# Patient Record
Sex: Female | Born: 1960
Health system: Southern US, Community
[De-identification: ages and names within clinical notes are randomized; demographics above are authoritative.]

## PROBLEM LIST (undated history)

## (undated) DIAGNOSIS — E079 Disorder of thyroid, unspecified: Secondary | ICD-10-CM

## (undated) HISTORY — DX: Disorder of thyroid, unspecified: E07.9

---

## 2003-11-27 ENCOUNTER — Other Ambulatory Visit: Admission: RE | Admit: 2003-11-27 | Discharge: 2003-11-27 | Payer: Self-pay | Admitting: Family Medicine

## 2003-12-29 ENCOUNTER — Encounter: Admission: RE | Admit: 2003-12-29 | Discharge: 2003-12-29 | Payer: Self-pay | Admitting: Family Medicine

## 2004-10-28 ENCOUNTER — Encounter: Admission: RE | Admit: 2004-10-28 | Discharge: 2004-10-28 | Payer: Self-pay | Admitting: Family Medicine

## 2005-02-25 ENCOUNTER — Encounter: Admission: RE | Admit: 2005-02-25 | Discharge: 2005-02-25 | Payer: Self-pay | Admitting: Family Medicine

## 2005-03-08 ENCOUNTER — Encounter: Admission: RE | Admit: 2005-03-08 | Discharge: 2005-03-08 | Payer: Self-pay | Admitting: Family Medicine

## 2005-03-30 ENCOUNTER — Other Ambulatory Visit: Admission: RE | Admit: 2005-03-30 | Discharge: 2005-03-30 | Payer: Self-pay | Admitting: Family Medicine

## 2005-08-05 ENCOUNTER — Ambulatory Visit: Payer: Self-pay | Admitting: Family Medicine

## 2005-08-31 ENCOUNTER — Encounter: Admission: RE | Admit: 2005-08-31 | Discharge: 2005-08-31 | Payer: Self-pay | Admitting: Family Medicine

## 2006-03-03 ENCOUNTER — Encounter: Admission: RE | Admit: 2006-03-03 | Discharge: 2006-03-03 | Payer: Self-pay | Admitting: Obstetrics and Gynecology

## 2006-11-01 ENCOUNTER — Encounter: Admission: RE | Admit: 2006-11-01 | Discharge: 2006-11-01 | Payer: Self-pay | Admitting: Obstetrics and Gynecology

## 2007-03-07 ENCOUNTER — Encounter: Admission: RE | Admit: 2007-03-07 | Discharge: 2007-03-07 | Payer: Self-pay | Admitting: Obstetrics and Gynecology

## 2007-03-09 ENCOUNTER — Encounter: Admission: RE | Admit: 2007-03-09 | Discharge: 2007-03-09 | Payer: Self-pay | Admitting: Obstetrics and Gynecology

## 2008-03-12 ENCOUNTER — Encounter: Admission: RE | Admit: 2008-03-12 | Discharge: 2008-03-12 | Payer: Self-pay | Admitting: Obstetrics and Gynecology

## 2008-03-17 ENCOUNTER — Encounter: Admission: RE | Admit: 2008-03-17 | Discharge: 2008-03-17 | Payer: Self-pay | Admitting: Obstetrics and Gynecology

## 2009-03-18 ENCOUNTER — Encounter: Admission: RE | Admit: 2009-03-18 | Discharge: 2009-03-18 | Payer: Self-pay | Admitting: Obstetrics and Gynecology

## 2010-03-19 ENCOUNTER — Encounter: Admission: RE | Admit: 2010-03-19 | Discharge: 2010-03-19 | Payer: Self-pay | Admitting: Obstetrics and Gynecology

## 2011-08-15 ENCOUNTER — Other Ambulatory Visit: Payer: Self-pay | Admitting: Obstetrics and Gynecology

## 2012-02-22 ENCOUNTER — Other Ambulatory Visit: Payer: Self-pay | Admitting: Obstetrics and Gynecology

## 2012-02-22 DIAGNOSIS — Z1231 Encounter for screening mammogram for malignant neoplasm of breast: Secondary | ICD-10-CM

## 2012-03-09 ENCOUNTER — Ambulatory Visit
Admission: RE | Admit: 2012-03-09 | Discharge: 2012-03-09 | Disposition: A | Discharge status: Home / Self Care | Payer: 59 | Source: Ambulatory Visit | Attending: Obstetrics and Gynecology | Admitting: Obstetrics and Gynecology

## 2012-03-09 DIAGNOSIS — Z1231 Encounter for screening mammogram for malignant neoplasm of breast: Secondary | ICD-10-CM

## 2013-06-04 ENCOUNTER — Other Ambulatory Visit: Payer: Self-pay

## 2013-06-04 DIAGNOSIS — Z1231 Encounter for screening mammogram for malignant neoplasm of breast: Secondary | ICD-10-CM

## 2013-07-08 ENCOUNTER — Ambulatory Visit
Admission: RE | Admit: 2013-07-08 | Discharge: 2013-07-08 | Disposition: A | Discharge status: Home / Self Care | Payer: 59 | Source: Ambulatory Visit

## 2013-07-08 DIAGNOSIS — Z1231 Encounter for screening mammogram for malignant neoplasm of breast: Secondary | ICD-10-CM

## 2014-04-15 ENCOUNTER — Other Ambulatory Visit: Payer: Self-pay

## 2014-04-15 DIAGNOSIS — Z1231 Encounter for screening mammogram for malignant neoplasm of breast: Secondary | ICD-10-CM

## 2014-07-09 ENCOUNTER — Ambulatory Visit: Payer: 59

## 2014-08-14 ENCOUNTER — Ambulatory Visit
Admission: RE | Admit: 2014-08-14 | Discharge: 2014-08-14 | Disposition: A | Discharge status: Home / Self Care | Payer: 59 | Source: Ambulatory Visit

## 2014-08-14 DIAGNOSIS — Z1231 Encounter for screening mammogram for malignant neoplasm of breast: Secondary | ICD-10-CM

## 2015-07-14 ENCOUNTER — Other Ambulatory Visit: Payer: Self-pay

## 2015-07-14 DIAGNOSIS — Z1231 Encounter for screening mammogram for malignant neoplasm of breast: Secondary | ICD-10-CM

## 2015-08-18 ENCOUNTER — Ambulatory Visit: Payer: 59

## 2015-10-20 ENCOUNTER — Ambulatory Visit: Payer: 59

## 2016-10-04 DIAGNOSIS — E0789 Other specified disorders of thyroid: Secondary | ICD-10-CM | POA: Diagnosis not present

## 2016-10-04 DIAGNOSIS — E890 Postprocedural hypothyroidism: Secondary | ICD-10-CM | POA: Diagnosis not present

## 2016-10-04 DIAGNOSIS — Z0000 Encounter for general adult medical examination without abnormal findings: Secondary | ICD-10-CM | POA: Diagnosis not present

## 2016-10-11 DIAGNOSIS — D225 Melanocytic nevi of trunk: Secondary | ICD-10-CM | POA: Diagnosis not present

## 2016-10-11 DIAGNOSIS — E559 Vitamin D deficiency, unspecified: Secondary | ICD-10-CM | POA: Diagnosis not present

## 2016-10-11 DIAGNOSIS — L820 Inflamed seborrheic keratosis: Secondary | ICD-10-CM | POA: Diagnosis not present

## 2016-10-11 DIAGNOSIS — E032 Hypothyroidism due to medicaments and other exogenous substances: Secondary | ICD-10-CM | POA: Diagnosis not present

## 2016-10-11 DIAGNOSIS — L821 Other seborrheic keratosis: Secondary | ICD-10-CM | POA: Diagnosis not present

## 2016-10-11 DIAGNOSIS — L814 Other melanin hyperpigmentation: Secondary | ICD-10-CM | POA: Diagnosis not present

## 2016-12-23 DIAGNOSIS — Z01419 Encounter for gynecological examination (general) (routine) without abnormal findings: Secondary | ICD-10-CM | POA: Diagnosis not present

## 2017-10-06 ENCOUNTER — Ambulatory Visit (INDEPENDENT_AMBULATORY_CARE_PROVIDER_SITE_OTHER): Payer: 59 | Admitting: Nurse Practitioner

## 2017-10-06 ENCOUNTER — Encounter: Payer: Self-pay | Admitting: Nurse Practitioner

## 2017-10-06 VITALS — BP 132/86 | Wt 135.0 lb

## 2017-10-06 DIAGNOSIS — E890 Postprocedural hypothyroidism: Secondary | ICD-10-CM

## 2017-10-06 MED ORDER — LEVOTHYROXINE SODIUM 125 MCG PO TABS: 125.0000 ug | ORAL_TABLET | Freq: Every day | ORAL | 3 refills | Status: DC

## 2017-10-07 ENCOUNTER — Encounter: Payer: Self-pay | Admitting: Nurse Practitioner

## 2017-10-07 DIAGNOSIS — E890 Postprocedural hypothyroidism: Secondary | ICD-10-CM | POA: Insufficient documentation

## 2017-10-07 NOTE — Progress Notes (Signed)
Subjective: Presents to establish herself as a new patient to our practice.  Gets regular preventive health physicals with gynecology.  Her next appointment is in June, she will get a mammogram and bone density as well at that visit.  Her last appointment was with her endocrinologist in January, had lab work done which she states was normal, results unavailable during office visit.  Was diagnosed with hyperthyroidism in 1999.  Received radioactive iodine which resulted in hypothyroidism.  No difficulty swallowing.  No sore throats or throat pain.  Postmenopausal.  Had her last colonoscopy about 2 years ago which was normal.  Donates blood on a regular basis, has been screened for HIV and hepatitis C.  States her weight has been stable.  Very active lifestyle.  Objective:   BP 132/86   Wt 135 lb (61.2 kg)  NAD.  Alert, oriented.  Cheerful affect.  Lungs clear.  Heart regular rate and rhythm.  Thyroid nontender to palpation, no mass or goiter noted.  Assessment:   Problem List Items Addressed This Visit      Endocrine   Postablative hypothyroidism - Primary   Relevant Medications   levothyroxine (SYNTHROID, LEVOTHROID) 125 MCG tablet       Plan:   Meds ordered this encounter  Medications  . levothyroxine (SYNTHROID, LEVOTHROID) 125 MCG tablet    Sig: Take 1 tablet (125 mcg total) by mouth daily before breakfast.    Dispense:  90 tablet    Refill:  3    Please dispense name brand Synthroid    Order Specific Question:   Supervising Provider    Answer:   Mikey Kirschner [[id number]]   Continue Synthroid at current dose.  Follow-up with gynecology this summer for her physical.  States she had all her screening done with last lab work in January.  Recommend that she bring a copy to her next visit. Return in about 1 year (around 10/07/2018) for recheck thyroid.

## 2017-10-27 DIAGNOSIS — L821 Other seborrheic keratosis: Secondary | ICD-10-CM | POA: Diagnosis not present

## 2017-10-27 DIAGNOSIS — D485 Neoplasm of uncertain behavior of skin: Secondary | ICD-10-CM | POA: Diagnosis not present

## 2017-10-27 DIAGNOSIS — D1801 Hemangioma of skin and subcutaneous tissue: Secondary | ICD-10-CM | POA: Diagnosis not present

## 2017-10-27 DIAGNOSIS — L439 Lichen planus, unspecified: Secondary | ICD-10-CM | POA: Diagnosis not present

## 2017-10-27 DIAGNOSIS — D225 Melanocytic nevi of trunk: Secondary | ICD-10-CM | POA: Diagnosis not present

## 2018-01-10 DIAGNOSIS — Z6826 Body mass index (BMI) 26.0-26.9, adult: Secondary | ICD-10-CM | POA: Diagnosis not present

## 2018-01-10 DIAGNOSIS — Z01419 Encounter for gynecological examination (general) (routine) without abnormal findings: Secondary | ICD-10-CM | POA: Diagnosis not present

## 2018-10-12 DIAGNOSIS — S99922A Unspecified injury of left foot, initial encounter: Secondary | ICD-10-CM | POA: Diagnosis not present

## 2018-10-12 DIAGNOSIS — S90852A Superficial foreign body, left foot, initial encounter: Secondary | ICD-10-CM | POA: Diagnosis not present

## 2018-10-15 ENCOUNTER — Other Ambulatory Visit: Payer: Self-pay

## 2018-10-15 ENCOUNTER — Ambulatory Visit (INDEPENDENT_AMBULATORY_CARE_PROVIDER_SITE_OTHER): Payer: 59 | Admitting: Family Medicine

## 2018-10-15 ENCOUNTER — Encounter: Payer: Self-pay | Admitting: Family Medicine

## 2018-10-15 VITALS — BP 122/72 | Temp 98.1°F

## 2018-10-15 DIAGNOSIS — L03116 Cellulitis of left lower limb: Secondary | ICD-10-CM | POA: Diagnosis not present

## 2018-10-15 MED ORDER — SULFAMETHOXAZOLE-TRIMETHOPRIM 800-160 MG PO TABS: ORAL_TABLET | ORAL | 0 refills | Status: DC

## 2018-10-15 MED ORDER — CEPHALEXIN 500 MG PO CAPS: ORAL_CAPSULE | ORAL | 0 refills | Status: DC

## 2018-10-15 NOTE — Progress Notes (Signed)
   Subjective:    Patient ID: Susan Cabrera, female    DOB: 1960-08-08, 58 y.o.   MRN: [id number]  HPI Pt here today for foot pain. Pt states she stepped on a branch and it went into her left foot. Pt did go to ER. ER did get most of branch out. Foot is swollen and red, does throb. Pt states she is unable to put pressure on foot.    Pt went to [facility] hospital. Pt was prescribed Cephalexin 500 and Bactrin DS. Pt was also prescribed Oxycodone 5/325 mg but patient has only taken one dose. Pt has been alternating Tylenol and Motrin and that is working for patient.  Patient was up-to-date on tetanus shot  Compliant with antibiotics.  Reports that foot seems a little better today.  Not sure.  No fever chills good appetite   Review of Systems No headache, no major weight loss or weight gain, no chest pain no back pain abdominal pain no change in bowel habits complete ROS otherwise negative     Objective:   Physical Exam Alert vitals stable, NAD. Blood pressure good on repeat. HEENT normal. Lungs clear. Heart regular rate and rhythm. Cellulitis left foot dorsal erythema swelling highly tender plantar surface entrance wound no palpable foreign body also tender no active discharge       Assessment & Plan:  Impression puncture wound with rather large of wood and patient brings in.  Now secondary cellulitis.  Management discussed at length.  Elevate leg when possible.  Increase Keflex to 4 4 times daily and maintain Bactrim DS twice daily for 10 full days post.  Warning signs discussed.  And further warning signs discussed regarding potential residual foreign body  Greater than 50% of this 25 minute face to face visit was spent in counseling and discussion and coordination of care regarding the above diagnosis/diagnosies

## 2018-10-22 ENCOUNTER — Telehealth: Payer: Self-pay | Admitting: Family Medicine

## 2018-10-22 NOTE — Telephone Encounter (Signed)
Left message to return call 

## 2018-10-22 NOTE — Telephone Encounter (Signed)
Rash started yesterday. Was tolerable. Had one more day to take ABT. No trouble breathing, no fever, no chills, no cough, no headache. Foot is better, swelling and redness that was in leg has went away. Pt states she wanted to inform provider. Pt states she knows the rash is coming from ABT. Pt states she does not feel the need to be seen

## 2018-10-22 NOTE — Telephone Encounter (Signed)
Ok document abx allergy, may use otc benadryl 25 q six hrs prn if gets real symtomatic

## 2018-10-22 NOTE — Telephone Encounter (Signed)
Documented allergy. Left message for patient to return call to let her know that she may use Benadryl.

## 2018-10-22 NOTE — Telephone Encounter (Signed)
Patient states foot is better but now she has a rash all over her body and she stopped taking antibiotic this morning due to rash was on it for 5 days 4 times a day.

## 2018-10-23 NOTE — Telephone Encounter (Signed)
Left message to return call 

## 2018-10-26 NOTE — Telephone Encounter (Signed)
Left message to return call 

## 2018-10-29 ENCOUNTER — Telehealth: Payer: Self-pay | Admitting: Family Medicine

## 2018-10-29 ENCOUNTER — Other Ambulatory Visit: Payer: Self-pay | Admitting: *Deleted

## 2018-10-29 MED ORDER — LEVOTHYROXINE SODIUM 125 MCG PO TABS: 125.0000 ug | ORAL_TABLET | Freq: Every day | ORAL | 0 refills | Status: DC

## 2018-10-29 NOTE — Telephone Encounter (Signed)
Three mo worth

## 2018-10-29 NOTE — Telephone Encounter (Signed)
Pharmacy requesting refill on Synthroid 125 mcg tablet. Pt last seen 10/06/17 for postablative hypothyroidism. Please advise. Thank you

## 2018-10-29 NOTE — Telephone Encounter (Signed)
Left message to return call 

## 2018-10-29 NOTE — Telephone Encounter (Signed)
Refills sent

## 2018-10-31 NOTE — Telephone Encounter (Signed)
Pt has not called after multiple messages left. Message left 9 days ago about a rash. Can we close message?

## 2018-10-31 NOTE — Telephone Encounter (Signed)
yes

## 2018-11-19 ENCOUNTER — Other Ambulatory Visit: Payer: Self-pay | Admitting: *Deleted

## 2018-11-19 ENCOUNTER — Telehealth: Payer: Self-pay | Admitting: Family Medicine

## 2018-11-19 ENCOUNTER — Telehealth: Payer: Self-pay | Admitting: *Deleted

## 2018-11-19 DIAGNOSIS — S90859A Superficial foreign body, unspecified foot, initial encounter: Secondary | ICD-10-CM

## 2018-11-19 MED ORDER — DOXYCYCLINE HYCLATE 100 MG PO TABS: 100.0000 mg | ORAL_TABLET | Freq: Two times a day (BID) | ORAL | 0 refills | Status: DC

## 2018-11-19 NOTE — Telephone Encounter (Signed)
Discussed with pt. Pt verbalized understanding. And she now states she does not care which surgeon she sees whoever can get her in the quickest

## 2018-11-19 NOTE — Telephone Encounter (Signed)
Pt had puncture wound with a piece of wood on march 31st. Was seen for cellulitis and finished antibiotic. Foot is still swollen and pt thinks it is a piece of wood still in foot. A piece of wood did come out since being seen. Consult with dr Richardson Landry because pt wanted to come in office today and dr Richardson Landry wants to call in antibiotic and set pt up with a surgeon. Tried to call pt no answer

## 2018-11-19 NOTE — Telephone Encounter (Signed)
Pt called and wanted to be checked in the office. Puncture wound with a piece of wood march 31st. Pt was seen and states her foot is worse. Swollen. Has gotten a piece of wood out of foot since being seen and thinks there is another piece of wood in it. Consult with dr Richardson Landry. Will call in antibiotic and set pt up with a general surgeon. Tried to call pt no answer.

## 2018-11-19 NOTE — Telephone Encounter (Signed)
Patient calling back to tell us that the place she wanted to go doesn't do feet and now wants [place] to refer her to Dr. Ninfa Linden who is a TEFL teacher in [place]. (She thinks)

## 2018-11-19 NOTE — Telephone Encounter (Signed)
Med sent to [facility] and referral put in. Tried to call pt no answer

## 2018-11-19 NOTE — Telephone Encounter (Signed)
Doxy 100 bid ten d, surg referal

## 2018-11-19 NOTE — Telephone Encounter (Signed)
Pt was put on bactrim ds and keflex. She states she took for about 6 days and then stopped because she started getting hives but her foot was good then so she didn't call for another antibiotic. [facility] [place] and would like to see surgeon in [place] at the surgical center on [address]. Referral put in.

## 2018-11-20 ENCOUNTER — Ambulatory Visit (INDEPENDENT_AMBULATORY_CARE_PROVIDER_SITE_OTHER): Payer: 59

## 2018-11-20 ENCOUNTER — Ambulatory Visit (INDEPENDENT_AMBULATORY_CARE_PROVIDER_SITE_OTHER): Payer: 59 | Admitting: Sports Medicine

## 2018-11-20 ENCOUNTER — Other Ambulatory Visit: Payer: Self-pay | Admitting: Sports Medicine

## 2018-11-20 ENCOUNTER — Other Ambulatory Visit: Payer: Self-pay

## 2018-11-20 ENCOUNTER — Encounter: Payer: Self-pay | Admitting: Sports Medicine

## 2018-11-20 VITALS — BP 127/81 | HR 83 | Temp 98.1°F

## 2018-11-20 DIAGNOSIS — S91332A Puncture wound without foreign body, left foot, initial encounter: Secondary | ICD-10-CM | POA: Diagnosis not present

## 2018-11-20 DIAGNOSIS — L02619 Cutaneous abscess of unspecified foot: Secondary | ICD-10-CM

## 2018-11-20 DIAGNOSIS — M79672 Pain in left foot: Secondary | ICD-10-CM

## 2018-11-20 DIAGNOSIS — L03119 Cellulitis of unspecified part of limb: Secondary | ICD-10-CM

## 2018-11-20 DIAGNOSIS — L929 Granulomatous disorder of the skin and subcutaneous tissue, unspecified: Secondary | ICD-10-CM

## 2018-11-20 DIAGNOSIS — M795 Residual foreign body in soft tissue: Secondary | ICD-10-CM

## 2018-11-20 NOTE — Progress Notes (Signed)
Subjective: Susan Cabrera is a 58 y.o. female patient seen in office for evaluation of ulceration of the left foot. Patient has a history of a tree branch stuck her foot on 3/27 and had a ER debridement procedure where the wood was removed. Reports on 4/16 another piece of wood came out and for the last 2 days has noticed that the swelling and redness has come back along with the puncture wound not healing and continuing to bleed.   Patient is changing the dressing using antibiotic cream and is currently on Doxycyline started on yesterday. Patient denies nausea/fever/vomiting/chills/night sweats/shortness of breath currently but thinks that she could of had a fever a few days ago. Patient reports pain with pressure and states that she has been walking funny to avoid putting pressure to the area. Pain 5/10. Patient has no other pedal complaints at this time.  Review of Systems  All other systems reviewed and are negative.   Patient Active Problem List   Diagnosis Date Noted  . Postablative hypothyroidism 10/07/2017   Current Outpatient Medications on File Prior to Visit  Medication Sig Dispense Refill  . cephALEXin (KEFLEX) 500 MG capsule Take 500 mg by mouth 3 (three) times daily.    . cephALEXin (KEFLEX) 500 MG capsule Take one tablet by mouth 4 times daily for 5 days 20 capsule 0  . doxycycline (VIBRA-TABS) 100 MG tablet Take 1 tablet (100 mg total) by mouth 2 (two) times daily. 20 tablet 0  . levothyroxine (SYNTHROID, LEVOTHROID) 125 MCG tablet Take 1 tablet (125 mcg total) by mouth daily before breakfast. 90 tablet 0  . oxyCODONE-acetaminophen (PERCOCET/ROXICET) 5-325 MG tablet Take 1-2 tablets by mouth every 4 (four) hours as needed. for pain    . sulfamethoxazole-trimethoprim (BACTRIM DS,SEPTRA DS) 800-160 MG tablet Take 1 tablet by mouth 2 (two) times daily.    Marland Kitchen sulfamethoxazole-trimethoprim (BACTRIM DS,SEPTRA DS) 800-160 MG tablet Take one tablet by mouth twice daily for 5 days 10  tablet 0  . valACYclovir (VALTREX) 1000 MG tablet TAKE 2 TABLETS BY MOUTH EVERY 12 HOURS FOR 2 DOSES     No current facility-administered medications on file prior to visit.    Allergies  Allergen Reactions  . Keflex [Cephalexin] Rash    No results found for this or any previous visit (from the past 2160 hour(s)).  Objective: There were no vitals filed for this visit.  General: Patient is awake, alert, oriented x 3 and in no acute distress.  Dermatology: Skin is warm and dry bilateral with a raised hypergranular ulceration measures approximately 0.5cm at left plantar distal arch. The ulceration does not  probe to bone. There is no malodor, no active drainage, faint erythema at 1st interspace and 1st MTPJ, trace edema. No other acute signs of infection.   Vascular: Dorsalis Pedis pulse =1 /4 Bilateral,  Posterior Tibial pulse = 1/4 Bilateral,  Capillary Fill Time < 5 seconds  Neurologic: Protective sensation intact via light touch bilateral.   Musculosketal: There is mild pain with palpation to puncture wound area. No pain with compression to calves bilateral. +midfoot arthritis dorsal bone spur bilateral.    No results for input(s): GRAMSTAIN, LABORGA in the last 8760 hours.  Assessment and Plan:  Problem List Items Addressed This Visit    None    Visit Diagnoses    Puncture wound of left foot, initial encounter    -  Primary   10-12-18   Relevant Orders   WOUND CULTURE   Pain in left  foot       Granuloma, skin       Residual foreign body in soft tissue       Cellulitis and abscess of foot, except toes           -Examined patient and discussed the progression of the wound and treatment alternatives. 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 -Patient to return to office in 1 week for follow up care and evaluation or sooner if problems arise.  [place] Martins, DPM

## 2018-11-21 ENCOUNTER — Telehealth: Payer: Self-pay | Admitting: *Deleted

## 2018-11-21 DIAGNOSIS — M86172 Other acute osteomyelitis, left ankle and foot: Secondary | ICD-10-CM

## 2018-11-21 DIAGNOSIS — Z01812 Encounter for preprocedural laboratory examination: Secondary | ICD-10-CM

## 2018-11-21 NOTE — Telephone Encounter (Signed)
Pt called states she was to have a MRI scheduled. I told pt I had called to see if she wanted to be scheduled for [place] or [place]. Pt stated [place]. I told pt I would send the orders to [facility] and they may call to schedule, for her to schedule 3-5 days out so pre-cert would be available.

## 2018-11-21 NOTE — Telephone Encounter (Signed)
-----   Message from [place], [place] sent at 11/20/2018 10:45 AM EDT ----- Regarding: MRI L foot Stepped on wood 09/2018 with wound and recurrent cellulitis R/o retained foreign body vs cellulitis Need for possible OR debridement Dr Chauncey Cruel

## 2018-11-21 NOTE — Telephone Encounter (Addendum)
Unable to leave a message pt's voicemail was full, requesting location pt preferred for MRI. Orders to Gretta Arab, RN for pre-cert, faxed to [facility].

## 2018-11-23 ENCOUNTER — Telehealth: Payer: Self-pay | Admitting: *Deleted

## 2018-11-23 LAB — WOUND CULTURE
GRAM STAIN:: NONE SEEN
MICRO NUMBER:: 446416
SPECIMEN QUALITY:: ADEQUATE

## 2018-11-23 MED ORDER — CIPROFLOXACIN HCL 500 MG PO TABS: 500.0000 mg | ORAL_TABLET | Freq: Two times a day (BID) | ORAL | 0 refills | Status: DC

## 2018-11-23 NOTE — Telephone Encounter (Signed)
Left statement,that due to the importance of the message I leave it on her voice mail, that Dr. Cannon Kettle had reviewed the culture results and to stop the doxycycline and begin the c[facility] that [facility] [place] on [address].

## 2018-11-23 NOTE — Telephone Encounter (Signed)
-----   Message from [place]is Martins, [place] sent at 11/23/2018  8:55 AM EDT ----- Let patient know that her culture came back + for Enterobacter. Have her to stop the doxcycline and send to her pharmacy cipro 500 bid x 10 days Thanks Dr Chauncey Cruel

## 2018-11-26 ENCOUNTER — Telehealth: Payer: Self-pay | Admitting: *Deleted

## 2018-11-26 ENCOUNTER — Other Ambulatory Visit: Payer: Self-pay

## 2018-11-26 ENCOUNTER — Ambulatory Visit (HOSPITAL_COMMUNITY)
Admission: RE | Admit: 2018-11-26 | Discharge: 2018-11-26 | Disposition: A | Discharge status: Home / Self Care | Payer: 59 | Source: Ambulatory Visit | Attending: Sports Medicine | Admitting: Sports Medicine

## 2018-11-26 DIAGNOSIS — M868X7 Other osteomyelitis, ankle and foot: Secondary | ICD-10-CM

## 2018-11-26 MED ORDER — GADOBUTROL 1 MMOL/ML IV SOLN
7.0000 mL | Freq: Once | INTRAVENOUS | Status: AC | PRN
  Administered 2018-11-26: 13:00:00 7 mL via INTRAVENOUS

## 2018-11-26 NOTE — Telephone Encounter (Signed)
I called [place] states pt's Member ID: [id number] expired 07/17/2018. Kathlee Nations found pt's updated [employer] ID: [id number], Group: [id number], Payor ID: [id number] valid 20/91/9802, and pre-cert would need to be with [facility] [phone].

## 2018-11-26 NOTE — Telephone Encounter (Signed)
Pt called and I informed of authorization and [place] Scheduling [phone].

## 2018-11-26 NOTE — Telephone Encounter (Signed)
I called and spoke with [facility] and she states the MRI Impressions show Foreign Body, abscess and erosion on the 1st plantar Metatarsal.

## 2018-11-26 NOTE — Telephone Encounter (Signed)
I informed pt [employer] had stated the Member ID on the card give to our office had expired and she should have that card for her appt with our office and Forestine Na, I would call with [facility] scheduling phone number once pre-certed.

## 2018-11-26 NOTE — Telephone Encounter (Signed)
Pt called states she would like to use either [facility] in [place] or [place].

## 2018-11-26 NOTE — Telephone Encounter (Signed)
[place] AUTHORIZED LEFT FOOT MRI [id number], AUTHORIZATION: [id number] FOR MEMBER ID: [id number], GROUP: [id number], PAYOR ID: [id number] VALID 11/26/2018 UNTIL 01/10/2019. Faxed to Main scheduling for [facility].

## 2018-11-26 NOTE — Telephone Encounter (Signed)
[facility] Radiology - Call Stat Report left message to call.

## 2018-11-26 NOTE — Telephone Encounter (Signed)
I will see the patient on tomorrow and discuss the results in person as well as her plan of care for these findings -Dr. Cannon Kettle

## 2018-11-26 NOTE — Telephone Encounter (Signed)
Left message informing Dr. Cannon Kettle, pt with MRN [id number] has an appt tomorrow 11:15am, stat MRI results are available and to call if there is a change of orders.

## 2018-11-26 NOTE — Telephone Encounter (Signed)
I called pt and left message the MRI had been approved and to contact [place] Scheduling [phone].

## 2018-11-26 NOTE — Telephone Encounter (Signed)
Pt states she called and the imaging center in [place] could get her in earlier than 12/06/2018, to have it pre-certed with [facility] in [place].

## 2018-11-26 NOTE — Telephone Encounter (Signed)
Pt called states she called the MRI center and she had not been scheduled and she said the earliest they could schedule her was 12/06/2018.

## 2018-11-26 NOTE — Telephone Encounter (Signed)
Left message to call with contact information for the location of MRI center she would prefer.

## 2018-11-27 ENCOUNTER — Encounter: Payer: Self-pay | Admitting: Sports Medicine

## 2018-11-27 ENCOUNTER — Telehealth: Payer: Self-pay | Admitting: *Deleted

## 2018-11-27 ENCOUNTER — Ambulatory Visit (INDEPENDENT_AMBULATORY_CARE_PROVIDER_SITE_OTHER): Payer: 59 | Admitting: Sports Medicine

## 2018-11-27 VITALS — Temp 95.2°F

## 2018-11-27 DIAGNOSIS — L929 Granulomatous disorder of the skin and subcutaneous tissue, unspecified: Secondary | ICD-10-CM

## 2018-11-27 DIAGNOSIS — S91332D Puncture wound without foreign body, left foot, subsequent encounter: Secondary | ICD-10-CM | POA: Diagnosis not present

## 2018-11-27 DIAGNOSIS — M868X7 Other osteomyelitis, ankle and foot: Secondary | ICD-10-CM

## 2018-11-27 DIAGNOSIS — L02619 Cutaneous abscess of unspecified foot: Secondary | ICD-10-CM

## 2018-11-27 DIAGNOSIS — L03119 Cellulitis of unspecified part of limb: Secondary | ICD-10-CM | POA: Diagnosis not present

## 2018-11-27 DIAGNOSIS — M795 Residual foreign body in soft tissue: Secondary | ICD-10-CM

## 2018-11-27 NOTE — Telephone Encounter (Signed)
DOS 12/03/2018, CPT CODES 22583 - INCISION AND REMOVAL FOREIGN BODY, 20245 - BONE BIOPSY  [employer]:  Effective - Term Dates 07/18/2018 - 07/18/2019  Individual In-Network Deductible Member's plan does not have an In-Network Individual Deductible. Out-of-Pocket Member's plan does not have an In-Network Individual Out-of-Pocket Maximum.  Family In-Network (Calendar Year) Deductible Deductible has been met  $0.00 remaining  $1,000.00 Plan Amt.   Out-of-Pocket $1,341.98 MET YTD  $2,658.02 remaining  $4,000.00 Plan Amt.  Physician Fees for Surgical and Medical Services 90% of eligible expenses after satisfying the deductible.  -----------------------------------------------------------------------------------------------------------------------------------------------------------  Notification or Prior Authorization is not required for the requested services  In response to the COVID-19 national emergency, Site of Service prior authorization requirements have been suspended for this code(s) through 12/16/2018.  Decision ID #:[id number]

## 2018-11-27 NOTE — Progress Notes (Signed)
Subjective: Susan Cabrera is a 58 y.o. female patient seen in office for evaluation of ulceration of the left foot after stepping on a stick/branch in the yard while gardening on 3/27. Patient had MRI on yesterday and is here for the results. Reports that the granular tissue appears to be doing better with less drainage and it appears to be shrinking but still painful with pressure. Post op shoe helps and being off foot helps. Reports no issues with Cipro of which she started on Friday after her cultures came back. No other issues noted.   Patient Active Problem List   Diagnosis Date Noted  . Postablative hypothyroidism 10/07/2017   Current Outpatient Medications on File Prior to Visit  Medication Sig Dispense Refill  . cephALEXin (KEFLEX) 500 MG capsule Take 500 mg by mouth 3 (three) times daily.    . cephALEXin (KEFLEX) 500 MG capsule Take one tablet by mouth 4 times daily for 5 days 20 capsule 0  . ciprofloxacin (CIPRO) 500 MG tablet Take 1 tablet (500 mg total) by mouth 2 (two) times daily. 20 tablet 0  . doxycycline (VIBRA-TABS) 100 MG tablet Take 1 tablet (100 mg total) by mouth 2 (two) times daily. 20 tablet 0  . levothyroxine (SYNTHROID, LEVOTHROID) 125 MCG tablet Take 1 tablet (125 mcg total) by mouth daily before breakfast. 90 tablet 0  . oxyCODONE-acetaminophen (PERCOCET/ROXICET) 5-325 MG tablet Take 1-2 tablets by mouth every 4 (four) hours as needed. for pain    . sulfamethoxazole-trimethoprim (BACTRIM DS,SEPTRA DS) 800-160 MG tablet Take 1 tablet by mouth 2 (two) times daily.    Marland Kitchen sulfamethoxazole-trimethoprim (BACTRIM DS,SEPTRA DS) 800-160 MG tablet Take one tablet by mouth twice daily for 5 days 10 tablet 0  . valACYclovir (VALTREX) 1000 MG tablet TAKE 2 TABLETS BY MOUTH EVERY 12 HOURS FOR 2 DOSES     No current facility-administered medications on file prior to visit.    Allergies  Allergen Reactions  . Keflex [Cephalexin] Rash    Recent Results (from the past 2160  hour(s))  WOUND CULTURE     Status: Abnormal   Collection Time: 11/20/18 12:13 PM  Result Value Ref Range   MICRO NUMBER: [id number]    SPECIMEN QUALITY: Adequate    SOURCE: NOT GIVEN    STATUS: FINAL    GRAM STAIN: No organisms or white blood cells seen    ISOLATE 1: Enterobacter cloacae complex (A)     Comment: Scant growth of Enterobacter cloacae complex      Susceptibility   Enterobacter cloacae complex - AEROBIC CULT, GRAM STAIN NEGATIVE 1    AMOX/CLAVULANIC >=32 Resistant     CEFAZOLIN* >=64 Resistant      * For infections other than uncomplicated UTIcaused by E. coli, K. pneumoniae or P. mirabilis:Cefazolin is resistant if MIC > or = 8 mcg/mL.(Distinguishing susceptible versus intermediatefor isolates with MIC < or = 4 mcg/mL requiresadditional testing.)    CEFEPIME <=1 Sensitive     CEFTRIAXONE <=1 Sensitive     CIPROFLOXACIN <=0.25 Sensitive     LEVOFLOXACIN <=0.12 Sensitive     ERTAPENEM <=0.5 Sensitive     GENTAMICIN <=1 Sensitive     IMIPENEM 2 Intermediate     PIP/TAZO <=4 Sensitive     TOBRAMYCIN <=1 Sensitive     TRIMETH/SULFA* <=20 Sensitive      * For infections other than uncomplicated UTIcaused by E. coli, K. pneumoniae or P. mirabilis:Cefazolin is resistant if MIC > or = 8 mcg/mL.(Distinguishing susceptible versus intermediatefor isolates  with MIC < or = 4 mcg/mL requiresadditional testing.)Legend:S = Susceptible  I = IntermediateR = Resistant  NS = Not susceptible* = Not tested  NR = Not reported**NN = See antimicrobic comments    History reviewed. No pertinent surgical history.  Social History   Socioeconomic History  . Marital status: Married    Spouse name: Not on file  . Number of children: Not on file  . Years of education: Not on file  . Highest education level: Not on file  Occupational History  . Not on file  Social Needs  . Financial resource strain: Not on file  . Food insecurity:    Worry: Not on file    Inability: Not on file  .  Transportation needs:    Medical: Not on file    Non-medical: Not on file  Tobacco Use  . Smoking status: Never Smoker  . Smokeless tobacco: Never Used  Substance and Sexual Activity  . Alcohol use: Not on file    Comment: rare social  . Drug use: Never  . Sexual activity: Yes    Birth control/protection: Surgical  Lifestyle  . Physical activity:    Days per week: Not on file    Minutes per session: Not on file  . Stress: Not on file  Relationships  . Social connections:    Talks on phone: Not on file    Gets together: Not on file    Attends religious service: Not on file    Active member of club or organization: Not on file    Attends meetings of clubs or organizations: Not on file    Relationship status: Not on file  Other Topics Concern  . Not on file  Social History Narrative  . Not on file    Family History  Problem Relation Age of Onset  . Cancer Mother   . Depression Mother   . Heart disease Father   . Heart disease Maternal Grandfather      Objective: There were no vitals filed for this visit.  General: Patient is awake, alert, oriented x 3 and in no acute distress.  Dermatology: Skin is warm and dry bilateral with a raised hypergranular ulceration measures 1x0.5cm at left plantar distal arch that appears to be less hypergranular. The ulceration does not probe to bone. There is no malodor, no active drainage, faint erythema at 1st interspace and 1st MTPJ, trace edema. No other acute signs of infection.   Vascular: Dorsalis Pedis pulse =1 /4 Bilateral,  Posterior Tibial pulse = 1/4 Bilateral,  Capillary Fill Time < 5 seconds  Neurologic: Protective sensation intact via light touch bilateral.   Musculosketal: There is mild pain with palpation to puncture wound area. No pain with compression to calves bilateral. +midfoot arthritis dorsal bone spur bilateral.    No results for input(s): GRAMSTAIN, LABORGA in the last 8760 hours.  Assessment and Plan:   Problem List Items Addressed This Visit    None    Visit Diagnoses    Granuloma, skin    -  Primary   Residual foreign body in soft tissue       Cellulitis and abscess of foot, except toes       Puncture wound of left foot, subsequent encounter       Other osteomyelitis of left foot ([place])         -Examined patient and discussed the progression of the wound and treatment alternatives. -MRI reviewed showing abscess, residual foreign body 18m, and  focal erosion at 1st met base suggestive of osteomyelitis  - Cleansed ulceration and applied silver nitrate and dry sterile dressing and instructed patient to continue with daily dressings at home consisting of same -Patient opt for surgical management. Consent obtained for Left foot incision and drainage with removal of foreign body and bone biopsy. Pre and Post op course explained. Risks, benefits, alternatives explained. No guarantees given or implied. Surgical booking slip submitted and provided patient with Surgical packet and info for [place] -Patient to use wedge post op shoe -Consult placed to ID for long term antibiotic recommendations. For now ill continue with Cipro until see by ID and until further intra-op cultures are available to help guide antibiotic treament - Advised patient to go to the ER or return to office if the wound worsens or if constitutional symptoms are present. -Patient to return to office after surgery or sooner if problems arise.  Landis Martins, DPM

## 2018-11-27 NOTE — Patient Instructions (Signed)
Pre-Operative Instructions  Congratulations, you have decided to take an important step towards improving your quality of life.  You can be assured that the doctors and staff at [facility] will be with you every step of the way.  Here are some important things you should know:  1. Plan to be at the surgery center/hospital at least 1 (one) hour prior to your scheduled time, unless otherwise directed by the surgical center/hospital staff.  You must have a responsible adult accompany you, remain during the surgery and drive you home.  Make sure you have directions to the surgical center/hospital to ensure you arrive on time. 2. If you are having surgery at [facility] or [facility], you will need a copy of your medical history and physical form from your family physician within one month prior to the date of surgery. We will give you a form for your primary physician to complete.  3. We make every effort to accommodate the date you request for surgery.  However, there are times where surgery dates or times have to be moved.  We will contact you as soon as possible if a change in schedule is required.   4. No aspirin/ibuprofen for one week before surgery.  If you are on aspirin, any non-steroidal anti-inflammatory medications (Mobic, Aleve, Ibuprofen) should not be taken seven (7) days prior to your surgery.  You make take Tylenol for pain prior to surgery.  5. Medications - If you are taking daily heart and blood pressure medications, seizure, reflux, allergy, asthma, anxiety, pain or diabetes medications, make sure you notify the surgery center/hospital before the day of surgery so they can tell you which medications you should take or avoid the day of surgery. 6. No food or drink after midnight the night before surgery unless directed otherwise by surgical center/hospital staff. 7. No alcoholic beverages 24-hours prior to surgery.  No smoking 24-hours prior or 24-hours after  surgery. 8. Wear loose pants or shorts. They should be loose enough to fit over bandages, boots, and casts. 9. Don't wear slip-on shoes. Sneakers are preferred. 10. Bring your boot with you to the surgery center/hospital.  Also bring crutches or a walker if your physician has prescribed it for you.  If you do not have this equipment, it will be provided for you after surgery. 11. If you have not been contacted by the surgery center/hospital by the day before your surgery, call to confirm the date and time of your surgery. 12. Leave-time from work may vary depending on the type of surgery you have.  Appropriate arrangements should be made prior to surgery with your employer. 13. Prescriptions will be provided immediately following surgery by your doctor.  Fill these as soon as possible after surgery and take the medication as directed. Pain medications will not be refilled on weekends and must be approved by the doctor. 14. Remove nail polish on the operative foot and avoid getting pedicures prior to surgery. 15. Wash the night before surgery.  The night before surgery wash the foot and leg well with water and the antibacterial soap provided. Be sure to pay special attention to beneath the toenails and in between the toes.  Wash for at least three (3) minutes. Rinse thoroughly with water and dry well with a towel.  Perform this wash unless told not to do so by your physician.  Enclosed: 1 Ice pack (please put in freezer the night before surgery)   1 Hibiclens skin cleaner     Pre-op instructions  If you have any questions regarding the instructions, please do not hesitate to call our office.  [place]: [address], [place], [place] [address] -- [phone]  [place]: [address] [place], [place] [address] -- [phone]  [place]: [address]  [place], [place] [address] -- [phone]  [place]: [address], [address], [place], [place] [address] -- [phone]  Website:  [email or web address] 

## 2018-11-28 ENCOUNTER — Telehealth: Payer: Self-pay | Admitting: *Deleted

## 2018-11-28 DIAGNOSIS — L929 Granulomatous disorder of the skin and subcutaneous tissue, unspecified: Secondary | ICD-10-CM

## 2018-11-28 DIAGNOSIS — M868X7 Other osteomyelitis, ankle and foot: Secondary | ICD-10-CM

## 2018-11-28 DIAGNOSIS — L03119 Cellulitis of unspecified part of limb: Secondary | ICD-10-CM

## 2018-11-28 DIAGNOSIS — M795 Residual foreign body in soft tissue: Secondary | ICD-10-CM

## 2018-11-28 DIAGNOSIS — L02619 Cutaneous abscess of unspecified foot: Secondary | ICD-10-CM

## 2018-11-28 DIAGNOSIS — S91332D Puncture wound without foreign body, left foot, subsequent encounter: Secondary | ICD-10-CM

## 2018-11-28 NOTE — Telephone Encounter (Signed)
-----   Message from Landis Martins, [place] sent at 11/27/2018  7:35 PM EDT ----- Regarding: Infectious Disease Consult Consult to ID for Left foot infection after stepping on stick/wood outdoors on 3/27. Wound culture +. Patient undergoing I&D on Monday with bone culture, MRI suggest focal Osteomyelitis at base of 1st metatarsal. -Dr. Cannon Kettle

## 2018-11-28 NOTE — Telephone Encounter (Signed)
Faxed clinicals, demographic with Infectious Disease required form.

## 2018-11-29 ENCOUNTER — Encounter: Payer: Self-pay | Admitting: Family

## 2018-11-29 ENCOUNTER — Ambulatory Visit (INDEPENDENT_AMBULATORY_CARE_PROVIDER_SITE_OTHER): Payer: 59 | Admitting: Family

## 2018-11-29 ENCOUNTER — Other Ambulatory Visit: Payer: Self-pay

## 2018-11-29 VITALS — BP 142/92 | HR 77 | Temp 98.0°F | Ht 60.0 in | Wt 135.0 lb

## 2018-11-29 DIAGNOSIS — S91332A Puncture wound without foreign body, left foot, initial encounter: Secondary | ICD-10-CM | POA: Insufficient documentation

## 2018-11-29 DIAGNOSIS — S91342A Puncture wound with foreign body, left foot, initial encounter: Secondary | ICD-10-CM

## 2018-11-29 DIAGNOSIS — M8610 Other acute osteomyelitis, unspecified site: Secondary | ICD-10-CM | POA: Diagnosis not present

## 2018-11-29 DIAGNOSIS — T148XXA Other injury of unspecified body region, initial encounter: Secondary | ICD-10-CM | POA: Diagnosis not present

## 2018-11-29 NOTE — Progress Notes (Signed)
Subjective:    Patient ID: Susan Cabrera, female    DOB: [birth year], 58 y.o.   MRN: [id number]  Chief Complaint  Patient presents with  . New Patient (Initial Visit)    surgery on 5/18, bone cultures will be obtained    HPI:  Susan Cabrera is a 58 y.o. female with previous history of thyroid disease who sustained a puncture wound/ulceration of her left foot under the first metatarsal on 10/12/18 following stepping on a stick/branch. She was evaluated in the ED having the wound debrided with removal of the wood and started was started on Keflex and Bactrim at discharge. She was subsequently seen by her PCP who increased her Keflex from tid to qid. After about 6 days of medication she developed a rash and stopped taking the medication. On 11/19/18 she contact her primary care office due to a small sliver of wood naturally working its way out and having foot swelling. She was started on doxycycline and referred to podiatry.  Susan Cabrera has been seeing Dr. Cannon Kettle with recommendations for wound care with silver nitrate and post-op wedge shoe. MRI of the left foot completed on 11/26/2018 with a 37mm linear foreign body at the plantar base of the first metatarsal with small sinus tract extending to the plantar skin surface and dorsally along the medial aspect of the proximal first metatarsal to a small 2 cm abscess. There was focal cortical erosion along the medial plantar base of the proximal first metatarsal adjancent to the sinus tract communicating between the foreign body and small abscess consistent with osteomyelitis. Wound cultures were obtained with culture results positive for Enterobacter cloacae. She was placed on Ciprofloxacin and referred to ID for antibiotic recommendations. She is planned for I&D at the surgical center on 12/03/18.   Susan Cabrera has been taking her Ciprofloxacin as prescribed with no adverse side effects or missed doses. She continues wound care per Dr. Leeanne Rio  instructions. Continues to have some drainage from the wound but feels the wound is slowly improving. No fevers, chills, sweats, myalgias, nausea, or diarrhea.   Allergies  Allergen Reactions  . Keflex [Cephalexin] Rash      Outpatient Medications Prior to Visit  Medication Sig Dispense Refill  . APPLE CIDER VINEGAR PO Take by mouth.    . COCONUT OIL PO Take by mouth.    . COLLAGEN PO Take by mouth.    . Cyanocobalamin (B-12 PO) Take by mouth.    . levothyroxine (SYNTHROID, LEVOTHROID) 125 MCG tablet Take 1 tablet (125 mcg total) by mouth daily before breakfast. 90 tablet 0  . silver nitrate applicators 78-29 % applicator Apply 1 Stick topically daily.    . TURMERIC PO Take by mouth.    . ciprofloxacin (CIPRO) 500 MG tablet Take 1 tablet (500 mg total) by mouth 2 (two) times daily. 20 tablet 0  . valACYclovir (VALTREX) 1000 MG tablet TAKE 2 TABLETS BY MOUTH EVERY 12 HOURS FOR 2 DOSES    . cephALEXin (KEFLEX) 500 MG capsule Take 500 mg by mouth 3 (three) times daily.    . cephALEXin (KEFLEX) 500 MG capsule Take one tablet by mouth 4 times daily for 5 days (Patient not taking: Reported on 11/29/2018) 20 capsule 0  . doxycycline (VIBRA-TABS) 100 MG tablet Take 1 tablet (100 mg total) by mouth 2 (two) times daily. (Patient not taking: Reported on 11/29/2018) 20 tablet 0  . oxyCODONE-acetaminophen (PERCOCET/ROXICET) 5-325 MG tablet Take 1-2 tablets by mouth every 4 (four) hours as  needed. for pain    . sulfamethoxazole-trimethoprim (BACTRIM DS,SEPTRA DS) 800-160 MG tablet Take 1 tablet by mouth 2 (two) times daily.    Marland Kitchen sulfamethoxazole-trimethoprim (BACTRIM DS,SEPTRA DS) 800-160 MG tablet Take one tablet by mouth twice daily for 5 days (Patient not taking: Reported on 11/29/2018) 10 tablet 0   No facility-administered medications prior to visit.      Past Medical History:  Diagnosis Date  . Thyroid disease    post ablation hyperthyroidism      History reviewed. No pertinent surgical  history.    Family History  Problem Relation Age of Onset  . Cancer Mother   . Depression Mother   . Heart disease Father   . Heart disease Maternal Grandfather       Social History   Socioeconomic History  . Marital status: Married    Spouse name: Not on file  . Number of children: Not on file  . Years of education: Not on file  . Highest education level: Not on file  Occupational History  . Not on file  Social Needs  . Financial resource strain: Not on file  . Food insecurity:    Worry: Not on file    Inability: Not on file  . Transportation needs:    Medical: Not on file    Non-medical: Not on file  Tobacco Use  . Smoking status: Never Smoker  . Smokeless tobacco: Never Used  Substance and Sexual Activity  . Alcohol use: Not on file    Comment: rare social  . Drug use: Never  . Sexual activity: Yes    Birth control/protection: Surgical  Lifestyle  . Physical activity:    Days per week: Not on file    Minutes per session: Not on file  . Stress: Not on file  Relationships  . Social connections:    Talks on phone: Not on file    Gets together: Not on file    Attends religious service: Not on file    Active member of club or organization: Not on file    Attends meetings of clubs or organizations: Not on file    Relationship status: Not on file  . Intimate partner violence:    Fear of current or ex partner: Not on file    Emotionally abused: Not on file    Physically abused: Not on file    Forced sexual activity: Not on file  Other Topics Concern  . Not on file  Social History Narrative  . Not on file      Review of Systems  Constitutional: Negative for chills and fever.  Respiratory: Negative for cough, chest tightness, shortness of breath and wheezing.   Cardiovascular: Negative for chest pain and palpitations.  Gastrointestinal: Negative for abdominal pain, constipation, diarrhea, nausea and vomiting.  Skin: Positive for wound.        Objective:    BP (!) 142/92   Pulse 77   Temp 98 F (36.7 C)   Ht 5' (1.524 m)   Wt 135 lb (61.2 kg)   SpO2 97% Comment: room air  BMI 26.37 kg/m  Nursing note and vital signs reviewed.  Physical Exam Constitutional:      General: She is not in acute distress.    Appearance: She is well-developed.  Cardiovascular:     Rate and Rhythm: Normal rate and regular rhythm.     Heart sounds: Normal heart sounds.  Pulmonary:     Effort: Pulmonary effort is normal.  Breath sounds: Normal breath sounds.  Skin:    General: Skin is warm and dry.  Neurological:     Mental Status: She is alert and oriented to person, place, and time.  Psychiatric:        Behavior: Behavior normal.        Thought Content: Thought content normal.        Judgment: Judgment normal.         Approximately 7 mm puncture with serosanguinous drainage. There is no tenderness or induration.      Assessment & Plan:   Problem List Items Addressed This Visit      Musculoskeletal and Integument   Acute osteomyelitis ([place])    Susan Cabrera has contiguous osteomyelitis of the first metatarsal due to puncture wound sustained from stepping on a stick. Scheduled for I&D on Monday 5/18 with foreign body and abscess present. Previous cultures positive for Enterobacter cloacae. Recommend stopping antibiotics until after surgery where biopsy and cultures can be obtained in order to maximize yield of culture to guide antibiotic therapy. Given osteomyelitis on MRI will likely require at least 6 weeks of antimicrobial therapy. Check inflammatory markers today for baseline. Will plan for follow up e-visit/office visit in 1 week to discuss plan of care pending culture results.         Other   Puncture wound with foreign body, left foot, initial encounter - Primary    Susan Cabrera has a puncture wound of her left foot sustained from stepping on a branch complicated now with osteomyelitis and abscess formation with retained  foreign body. Scheduled for I&D with Dr. Cannon Kettle. Recommend stopping antibiotics as discussed. Continue with wound care per Dr. Cannon Kettle.       Relevant Orders   C-reactive protein   Sedimentation rate       I have discontinued Nunzio Cory Riebel's cephALEXin, sulfamethoxazole-trimethoprim, cephALEXin, sulfamethoxazole-trimethoprim, doxycycline, oxyCODONE-acetaminophen, valACYclovir, and ciprofloxacin. I am also having her maintain her levothyroxine, Cyanocobalamin (B-12 PO), COCONUT OIL PO, TURMERIC PO, APPLE CIDER VINEGAR PO, COLLAGEN PO, and silver nitrate applicators.    Follow-up: Return in about 1 week (around 12/06/2018), or if symptoms worsen or fail to improve.    Terri Piedra, MSN, FNP-C Nurse Practitioner [facility] for Infectious Disease [place] [facility] Office phone: [phone] Pager: [place] number: [phone]

## 2018-11-29 NOTE — Patient Instructions (Signed)
Nice to see you!  We will check your inflammatory markers today.  Please STOP taking the Cirpofloxacin and avoid all antibiotics until after surgery.  After surgery okay to start taking Bactrim while awaiting culture results.  Follow up e-visit in 1 week to discuss treatment plan.

## 2018-11-29 NOTE — Assessment & Plan Note (Signed)
Ms. Susan Cabrera has a puncture wound of her left foot sustained from stepping on a branch complicated now with osteomyelitis and abscess formation with retained foreign body. Scheduled for I&D with Dr. Cannon Kettle. Recommend stopping antibiotics as discussed. Continue with wound care per Dr. Cannon Kettle.

## 2018-11-29 NOTE — Assessment & Plan Note (Addendum)
Susan Cabrera has contiguous osteomyelitis of the first metatarsal due to puncture wound sustained from stepping on a stick. Scheduled for I&D on Monday 5/18 with foreign body and abscess present. Previous cultures positive for Enterobacter cloacae. Recommend stopping antibiotics until after surgery where biopsy and cultures can be obtained in order to maximize yield of culture to guide antibiotic therapy. Given osteomyelitis on MRI will likely require at least 6 weeks of antimicrobial therapy. Check inflammatory markers today for baseline. Will plan for follow up e-visit/office visit in 1 week to discuss plan of care pending culture results.

## 2018-11-30 ENCOUNTER — Telehealth: Payer: Self-pay | Admitting: *Deleted

## 2018-11-30 LAB — C-REACTIVE PROTEIN: CRP: 1.6 mg/L (ref <8.0)

## 2018-11-30 LAB — SEDIMENTATION RATE: Sed Rate: 11 mm/h (ref 0–30)

## 2018-11-30 NOTE — Telephone Encounter (Signed)
Yes ID sent me their note. I am aware. We will hold antibiotics until after surgery -Dr. Chauncey Cruel

## 2018-11-30 NOTE — Telephone Encounter (Signed)
Pt states she has seen the Infectious Disease doctor and he wanted her off the antibiotics until after the bone biopsy.

## 2018-12-03 ENCOUNTER — Other Ambulatory Visit: Payer: Self-pay | Admitting: Sports Medicine

## 2018-12-03 DIAGNOSIS — S91342A Puncture wound with foreign body, left foot, initial encounter: Secondary | ICD-10-CM

## 2018-12-03 DIAGNOSIS — S91332D Puncture wound without foreign body, left foot, subsequent encounter: Secondary | ICD-10-CM

## 2018-12-03 DIAGNOSIS — Z9889 Other specified postprocedural states: Secondary | ICD-10-CM

## 2018-12-03 DIAGNOSIS — M86672 Other chronic osteomyelitis, left ankle and foot: Secondary | ICD-10-CM | POA: Diagnosis not present

## 2018-12-03 DIAGNOSIS — L923 Foreign body granuloma of the skin and subcutaneous tissue: Secondary | ICD-10-CM | POA: Diagnosis not present

## 2018-12-03 DIAGNOSIS — M86072 Acute hematogenous osteomyelitis, left ankle and foot: Secondary | ICD-10-CM | POA: Diagnosis not present

## 2018-12-03 DIAGNOSIS — S91342D Puncture wound with foreign body, left foot, subsequent encounter: Secondary | ICD-10-CM | POA: Diagnosis not present

## 2018-12-03 MED ORDER — IBUPROFEN 800 MG PO TABS: 800.0000 mg | ORAL_TABLET | Freq: Three times a day (TID) | ORAL | 0 refills | Status: DC | PRN

## 2018-12-03 MED ORDER — PROMETHAZINE HCL 25 MG PO TABS: 25.0000 mg | ORAL_TABLET | Freq: Three times a day (TID) | ORAL | 0 refills | Status: DC | PRN

## 2018-12-03 MED ORDER — DOCUSATE SODIUM 100 MG PO CAPS: 100.0000 mg | ORAL_CAPSULE | Freq: Two times a day (BID) | ORAL | 0 refills | Status: DC

## 2018-12-03 MED ORDER — TRAMADOL HCL 50 MG PO TABS: 50.0000 mg | ORAL_TABLET | Freq: Three times a day (TID) | ORAL | 0 refills | Status: AC | PRN

## 2018-12-03 MED ORDER — SULFAMETHOXAZOLE-TRIMETHOPRIM 800-160 MG PO TABS: 1.0000 | ORAL_TABLET | Freq: Two times a day (BID) | ORAL | 0 refills | Status: DC

## 2018-12-03 NOTE — Progress Notes (Signed)
Post op medications entered. Patient to resume Bactrim after surgery while we await cultures.  -Dr. Cannon Kettle

## 2018-12-04 ENCOUNTER — Telehealth: Payer: Self-pay | Admitting: Sports Medicine

## 2018-12-04 DIAGNOSIS — M86679 Other chronic osteomyelitis, unspecified ankle and foot: Secondary | ICD-10-CM | POA: Diagnosis not present

## 2018-12-04 DIAGNOSIS — L923 Foreign body granuloma of the skin and subcutaneous tissue: Secondary | ICD-10-CM | POA: Diagnosis not present

## 2018-12-04 NOTE — Telephone Encounter (Signed)
S/p Left foot I&D, removal of foreign body, bone biopsy/culture performed 12-03-18 by Dr. Cannon Kettle at [facility] -Dr. Cannon Kettle

## 2018-12-04 NOTE — Telephone Encounter (Signed)
Post op phone call made to patient. Patient reports that her leg is still numb and that she is doing well. Patient reports that she has been elevating and icing as instructed. I advised patient to change packing starting tomorrow as instructed. Patient instructed understanding. I advised her to call office if there are any other problems or concerns. -Dr. Cannon Kettle

## 2018-12-06 ENCOUNTER — Other Ambulatory Visit: Payer: Self-pay

## 2018-12-06 ENCOUNTER — Ambulatory Visit (INDEPENDENT_AMBULATORY_CARE_PROVIDER_SITE_OTHER): Payer: 59 | Admitting: Family

## 2018-12-06 ENCOUNTER — Other Ambulatory Visit: Payer: 59

## 2018-12-06 ENCOUNTER — Encounter: Payer: Self-pay | Admitting: Family

## 2018-12-06 DIAGNOSIS — M8610 Other acute osteomyelitis, unspecified site: Secondary | ICD-10-CM

## 2018-12-06 DIAGNOSIS — S91332D Puncture wound without foreign body, left foot, subsequent encounter: Secondary | ICD-10-CM | POA: Diagnosis not present

## 2018-12-06 MED ORDER — SULFAMETHOXAZOLE-TRIMETHOPRIM 800-160 MG PO TABS: 1.0000 | ORAL_TABLET | Freq: Two times a day (BID) | ORAL | 0 refills | Status: DC

## 2018-12-06 NOTE — Progress Notes (Signed)
Subjective:    Patient ID: Susan Cabrera, female    DOB: [birth year], 58 y.o.   MRN: [id number]  Chief Complaint  Patient presents with  . Follow-up     Virtual Visit via Telephone Note   I connected with Susan Cabrera on 12/06/2018 at 2:30 PM by telephone and verified that I am speaking with the correct person using two identifiers.   I discussed the limitations, risks, security and privacy concerns of performing an evaluation and management service by telephone and the availability of in person appointments. I also discussed with the patient that there may be a patient responsible charge related to this service. The patient expressed understanding and agreed to proceed.    HPI:  Susan Cabrera is a 58 y.o. female has a wound to her left foot sustained from stepping on a stick with retained foreign body complicated by osteomyelitis status post left foot surgery for debridement.  Initial cultures positive for Enterobacter cloacae.   Susan Cabrera is postop day 3 from surgery with cultures remaining pending currently on Bactrim with no adverse side effects and good adherence to her medication.  Wound care being provided by Dr. Cannon Kettle.  She has no signs/symptoms of infection at present including fevers, chills, sweats, or increasing drainage or odor.   Allergies  Allergen Reactions  . Keflex [Cephalexin] Rash      Outpatient Medications Prior to Visit  Medication Sig Dispense Refill  . APPLE CIDER VINEGAR PO Take by mouth.    . COCONUT OIL PO Take by mouth.    . COLLAGEN PO Take by mouth.    . Cyanocobalamin (B-12 PO) Take by mouth.    . docusate sodium (COLACE) 100 MG capsule Take 1 capsule (100 mg total) by mouth 2 (two) times daily. 10 capsule 0  . ibuprofen (ADVIL) 800 MG tablet Take 1 tablet (800 mg total) by mouth every 8 (eight) hours as needed. 30 tablet 0  . levothyroxine (SYNTHROID, LEVOTHROID) 125 MCG tablet Take 1 tablet (125 mcg total) by mouth daily before  breakfast. 90 tablet 0  . promethazine (PHENERGAN) 25 MG tablet Take 1 tablet (25 mg total) by mouth every 8 (eight) hours as needed for nausea or vomiting. 20 tablet 0  . traMADol (ULTRAM) 50 MG tablet Take 1 tablet (50 mg total) by mouth every 8 (eight) hours as needed for up to 5 days. 15 tablet 0  . TURMERIC PO Take by mouth.    . sulfamethoxazole-trimethoprim (BACTRIM DS) 800-160 MG tablet Take 1 tablet by mouth 2 (two) times daily. 28 tablet 0  . silver nitrate applicators 00-17 % applicator Apply 1 Stick topically daily.     No facility-administered medications prior to visit.      Past Medical History:  Diagnosis Date  . Thyroid disease    post ablation hyperthyroidism     History reviewed. No pertinent surgical history.     Review of Systems  Constitutional: Negative for chills and fever.  Respiratory: Negative for cough, chest tightness, shortness of breath and wheezing.   Cardiovascular: Negative for chest pain and palpitations.  Gastrointestinal: Negative for blood in stool, constipation, diarrhea, nausea and vomiting.      Objective:    Nursing note and vital signs reviewed.    Susan Cabrera is pleasant to speak with and sounds to be doing well despite foot pain.  No other physical exam able to be completed as this was a phone visit. Assessment & Plan:   Problem List Items  Addressed This Visit      Musculoskeletal and Integument   Acute osteomyelitis ([place])    Susan Cabrera has a puncture wound with retained foreign body complicated with osteomyelitis on MRI. Culture positive for Enterobacter cloacae. Surgical cultures remain pending. Will continue Bactrim pending culture results.       Relevant Medications   sulfamethoxazole-trimethoprim (BACTRIM DS) 800-160 MG tablet     Other   Puncture wound of left foot - Primary    Susan Cabrera has a puncture wound of the left foot with retained foreign body following stepping on a stick and is now s/p I&D with removal of  the stick. Continue follow up and wound care per Dr. Cannon Kettle.        Relevant Medications   sulfamethoxazole-trimethoprim (BACTRIM DS) 800-160 MG tablet       I am having Susan Cabrera maintain her levothyroxine, Cyanocobalamin (B-12 PO), COCONUT OIL PO, TURMERIC PO, APPLE CIDER VINEGAR PO, COLLAGEN PO, silver nitrate applicators, ibuprofen, traMADol, promethazine, docusate sodium, and sulfamethoxazole-trimethoprim.   Meds ordered this encounter  Medications  . sulfamethoxazole-trimethoprim (BACTRIM DS) 800-160 MG tablet    Sig: Take 1 tablet by mouth 2 (two) times daily.    Dispense:  60 tablet    Refill:  0    Order Specific Question:   Supervising Provider    Answer:   Carlyle Basques [phone]     I discussed the assessment and treatment plan with the patient. The patient was provided an opportunity to ask questions and all were answered. The patient agreed with the plan and demonstrated an understanding of the instructions.   The patient was advised to call back or seek an in-person evaluation if the symptoms worsen or if the condition fails to improve as anticipated.   I provided  10  minutes of non-face-to-face time during this encounter.  Follow-up: Return in about 1 month (around 01/06/2019), or if symptoms worsen or fail to improve.   Terri Piedra, MSN, FNP-C Nurse Practitioner [facility] for Infectious Disease [place] number: [phone]

## 2018-12-06 NOTE — Assessment & Plan Note (Deleted)
Ms. Hund has a puncture wound of the left foot with retained foreign body following stepping on a stick and is now s/p I&D with removal of the stick. Continue follow up and wound care per Dr. Cannon Kettle.

## 2018-12-06 NOTE — Assessment & Plan Note (Signed)
Susan Cabrera has a puncture wound with retained foreign body complicated with osteomyelitis on MRI. Culture positive for Enterobacter cloacae. Surgical cultures remain pending. Will continue Bactrim pending culture results.

## 2018-12-06 NOTE — Patient Instructions (Signed)
Nice to speak with you.  Please continue to take the Bactrim as prescribed.  We will continue to monitor cultures and adjust antibiotics accordingly.  Continue wound care per Dr. Cannon Kettle.  Plan for follow-up in 1 month or sooner if needed.

## 2018-12-06 NOTE — Assessment & Plan Note (Signed)
Susan Cabrera has a puncture wound of the left foot with retained foreign body following stepping on a stick and is now s/p I&D with removal of the stick. Continue follow up and wound care per Dr. Cannon Kettle.

## 2018-12-11 ENCOUNTER — Ambulatory Visit: Payer: 59

## 2018-12-11 ENCOUNTER — Other Ambulatory Visit: Payer: Self-pay

## 2018-12-11 ENCOUNTER — Encounter: Payer: Self-pay | Admitting: Sports Medicine

## 2018-12-11 ENCOUNTER — Ambulatory Visit (INDEPENDENT_AMBULATORY_CARE_PROVIDER_SITE_OTHER): Payer: Self-pay | Admitting: Sports Medicine

## 2018-12-11 DIAGNOSIS — S91342A Puncture wound with foreign body, left foot, initial encounter: Secondary | ICD-10-CM

## 2018-12-11 DIAGNOSIS — Z9889 Other specified postprocedural states: Secondary | ICD-10-CM

## 2018-12-11 DIAGNOSIS — M79672 Pain in left foot: Secondary | ICD-10-CM

## 2018-12-11 DIAGNOSIS — S91332D Puncture wound without foreign body, left foot, subsequent encounter: Secondary | ICD-10-CM

## 2018-12-11 NOTE — Progress Notes (Deleted)
Dg  

## 2018-12-11 NOTE — Progress Notes (Signed)
Subjective: Susan Cabrera is a 58 y.o. female patient seen today in office for POV #1 (DOS 12-03-18, S/P left foot ID and removal of foreign body with bone biopsy. Patient denies pain at surgical site if she has any she uses Tylenol PM, denies calf pain, denies headache, chest pain, shortness of breath, nausea, vomiting, fever, or chills. Patient states that she is packing area as instructed. No other issues noted.   Patient Active Problem List   Diagnosis Date Noted  . Puncture wound of left foot 11/29/2018  . Acute osteomyelitis ([place]) 11/29/2018  . Postablative hypothyroidism 10/07/2017    Current Outpatient Medications on File Prior to Visit  Medication Sig Dispense Refill  . APPLE CIDER VINEGAR PO Take by mouth.    . COCONUT OIL PO Take by mouth.    . COLLAGEN PO Take by mouth.    . Cyanocobalamin (B-12 PO) Take by mouth.    . docusate sodium (COLACE) 100 MG capsule Take 1 capsule (100 mg total) by mouth 2 (two) times daily. 10 capsule 0  . ibuprofen (ADVIL) 800 MG tablet Take 1 tablet (800 mg total) by mouth every 8 (eight) hours as needed. 30 tablet 0  . levothyroxine (SYNTHROID, LEVOTHROID) 125 MCG tablet Take 1 tablet (125 mcg total) by mouth daily before breakfast. 90 tablet 0  . promethazine (PHENERGAN) 25 MG tablet Take 1 tablet (25 mg total) by mouth every 8 (eight) hours as needed for nausea or vomiting. 20 tablet 0  . silver nitrate applicators 30-86 % applicator Apply 1 Stick topically daily.    Marland Kitchen sulfamethoxazole-trimethoprim (BACTRIM DS) 800-160 MG tablet Take 1 tablet by mouth 2 (two) times daily. 60 tablet 0  . TURMERIC PO Take by mouth.     No current facility-administered medications on file prior to visit.     Allergies  Allergen Reactions  . Keflex [Cephalexin] Rash    Objective: There were no vitals filed for this visit.  General: No acute distress, AAOx3  Left foot: Sutures intact with no gapping or dehiscence at surgical site, puncture wound open  measures >0.5cm with granular base to heal by secondary intention, mild swelling at arch, no erythema, no warmth, no drainage, no signs of infection noted, Capillary fill time <3 seconds in all digits, gross sensation present via light touch to left foot. No pain or crepitation with range of motion left foot but mild guarding at arch.  No pain with calf compression.   Assessment and Plan:  Problem List Items Addressed This Visit      Other   Puncture wound of left foot    Other Visit Diagnoses    S/P foot surgery, left    -  Primary   Relevant Orders   DG Foot Complete Left (Completed)   Left foot pain          -Patient seen and evaluated -Patient declined xrays  -Applied packing and dry sterile dressing to surgical site left foot secured with ACE wrap and stockinet  -Advised patient to continue with changing packing every other day  -Advised patient to continue with post-op shoe on left foot and crutches -Advised patient to limit activity to necessity  -Advised patient to ice and elevate as necessary  -Continue with Bactrim per ID recs; Awaiting intra-op culture results -Will plan for wound check at next office visit. In the meantime, patient to call office if any issues or problems arise.   Landis Martins, DPM

## 2018-12-13 ENCOUNTER — Telehealth: Payer: Self-pay | Admitting: *Deleted

## 2018-12-13 ENCOUNTER — Other Ambulatory Visit: Payer: Self-pay | Admitting: Family

## 2018-12-13 DIAGNOSIS — Z9889 Other specified postprocedural states: Secondary | ICD-10-CM

## 2018-12-13 DIAGNOSIS — S91332D Puncture wound without foreign body, left foot, subsequent encounter: Secondary | ICD-10-CM

## 2018-12-13 MED ORDER — SULFAMETHOXAZOLE-TRIMETHOPRIM 800-160 MG PO TABS: 1.0000 | ORAL_TABLET | Freq: Two times a day (BID) | ORAL | 0 refills | Status: DC

## 2018-12-13 MED ORDER — IBUPROFEN 800 MG PO TABS: 800.0000 mg | ORAL_TABLET | Freq: Three times a day (TID) | ORAL | 0 refills | Status: DC | PRN

## 2018-12-13 NOTE — Telephone Encounter (Signed)
I informed pt of Dr. Leeanne Rio refill order for Ibuprofen.

## 2018-12-13 NOTE — Telephone Encounter (Signed)
Pt states she was seen by Dr. Cannon Kettle and had forgotten to get a refill of ibuprofen, she had kind of a tough day yesterday and will see Dr. Cannon Kettle Tuesday.

## 2018-12-13 NOTE — Progress Notes (Signed)
Spoke with Ms. Susan Cabrera regarding updated plan of care. No osteomyelitis noted on bone biopsy. Will recommend treatment of 4 weeks from date of surgery at this point which would place end date at 12/31/18. Will continue Bactrim pending culture results.

## 2018-12-18 ENCOUNTER — Ambulatory Visit (INDEPENDENT_AMBULATORY_CARE_PROVIDER_SITE_OTHER): Payer: 59 | Admitting: Sports Medicine

## 2018-12-18 ENCOUNTER — Other Ambulatory Visit: Payer: Self-pay

## 2018-12-18 ENCOUNTER — Encounter: Payer: Self-pay | Admitting: Sports Medicine

## 2018-12-18 VITALS — Temp 98.4°F

## 2018-12-18 DIAGNOSIS — S91332D Puncture wound without foreign body, left foot, subsequent encounter: Secondary | ICD-10-CM

## 2018-12-18 DIAGNOSIS — Z9889 Other specified postprocedural states: Secondary | ICD-10-CM

## 2018-12-18 DIAGNOSIS — M79672 Pain in left foot: Secondary | ICD-10-CM

## 2018-12-18 NOTE — Progress Notes (Signed)
Subjective: Susan Cabrera is a 58 y.o. female patient seen today in office for POV #2 (DOS 12-03-18, S/P left foot ID and removal of foreign body with bone biopsy. Patient denies pain at surgical currently less tender and feels like its getting better, denies calf pain, denies headache, chest pain, shortness of breath, nausea, vomiting, fever, or chills. Patient states that she is packing area as instructed and taking Bactrim. No other issues noted.   Patient Active Problem List   Diagnosis Date Noted  . Puncture wound of left foot 11/29/2018  . Acute osteomyelitis ([place]) 11/29/2018  . Postablative hypothyroidism 10/07/2017    Current Outpatient Medications on File Prior to Visit  Medication Sig Dispense Refill  . APPLE CIDER VINEGAR PO Take by mouth.    . COCONUT OIL PO Take by mouth.    . COLLAGEN PO Take by mouth.    . Cyanocobalamin (B-12 PO) Take by mouth.    . docusate sodium (COLACE) 100 MG capsule Take 1 capsule (100 mg total) by mouth 2 (two) times daily. 10 capsule 0  . ibuprofen (ADVIL) 800 MG tablet Take 1 tablet (800 mg total) by mouth every 8 (eight) hours as needed. 30 tablet 0  . levothyroxine (SYNTHROID, LEVOTHROID) 125 MCG tablet Take 1 tablet (125 mcg total) by mouth daily before breakfast. 90 tablet 0  . promethazine (PHENERGAN) 25 MG tablet Take 1 tablet (25 mg total) by mouth every 8 (eight) hours as needed for nausea or vomiting. 20 tablet 0  . silver nitrate applicators 54-65 % applicator Apply 1 Stick topically daily.    Marland Kitchen sulfamethoxazole-trimethoprim (BACTRIM DS) 800-160 MG tablet Take 1 tablet by mouth 2 (two) times daily. 28 tablet 0  . TURMERIC PO Take by mouth.     No current facility-administered medications on file prior to visit.     Allergies  Allergen Reactions  . Keflex [Cephalexin] Rash    Objective: There were no vitals filed for this visit.  General: No acute distress, AAOx3  Left foot: Sutures intact with no gapping or dehiscence at  surgical site, puncture wound open measures >0.4cm with granular base to heal by secondary intention, mild swelling at arch, no erythema, no warmth, no drainage, no signs of infection noted, Capillary fill time <3 seconds in all digits, gross sensation present via light touch to left foot. No pain or crepitation with range of motion left foot but mild guarding at arch.  No pain with calf compression.   Assessment and Plan:  Problem List Items Addressed This Visit      Other   Puncture wound of left foot    Other Visit Diagnoses    S/P foot surgery, left    -  Primary   Left foot pain          -Patient seen and evaluated -2 sutures were removed -Applied packing and dry sterile dressing to surgical site left foot secured with ACE wrap and stockinet  -Advised patient to continue with changing packing every other day  -Advised patient to continue with post-op shoe on left foot and crutches or cane -Advised patient to limit activity to necessity  -Advised patient to ice and elevate as necessary  -Continue with Bactrim per ID recs to end 12/31/18 -Will plan for wound check at next office visit. In the meantime, patient to call office if any issues or problems arise.   Landis Martins, DPM

## 2018-12-25 ENCOUNTER — Ambulatory Visit (INDEPENDENT_AMBULATORY_CARE_PROVIDER_SITE_OTHER): Payer: 59 | Admitting: Sports Medicine

## 2018-12-25 ENCOUNTER — Other Ambulatory Visit: Payer: Self-pay

## 2018-12-25 ENCOUNTER — Encounter: Payer: Self-pay | Admitting: Sports Medicine

## 2018-12-25 VITALS — Temp 97.5°F

## 2018-12-25 DIAGNOSIS — M79672 Pain in left foot: Secondary | ICD-10-CM

## 2018-12-25 DIAGNOSIS — M792 Neuralgia and neuritis, unspecified: Secondary | ICD-10-CM

## 2018-12-25 DIAGNOSIS — Z9889 Other specified postprocedural states: Secondary | ICD-10-CM

## 2018-12-25 DIAGNOSIS — S91332D Puncture wound without foreign body, left foot, subsequent encounter: Secondary | ICD-10-CM

## 2018-12-25 NOTE — Progress Notes (Signed)
Subjective: Cilicia Borden is a 58 y.o. female patient seen today in office for POV #3 (DOS 12-03-18, S/P left foot ID and removal of foreign body with bone biopsy. Patient denies pain at surgical currently but pain with light touch that is sharp with numbness at ball, denies calf pain, denies headache, chest pain, shortness of breath, nausea, vomiting, fever, or chills. Patient states that she is packing area as instructed and taking Bactrim like before and has not gotten any other update from ID. No other issues noted.   Patient Active Problem List   Diagnosis Date Noted  . Puncture wound of left foot 11/29/2018  . Acute osteomyelitis ([place]) 11/29/2018  . Postablative hypothyroidism 10/07/2017    Current Outpatient Medications on File Prior to Visit  Medication Sig Dispense Refill  . APPLE CIDER VINEGAR PO Take by mouth.    . COCONUT OIL PO Take by mouth.    . COLLAGEN PO Take by mouth.    . Cyanocobalamin (B-12 PO) Take by mouth.    . docusate sodium (COLACE) 100 MG capsule Take 1 capsule (100 mg total) by mouth 2 (two) times daily. 10 capsule 0  . ibuprofen (ADVIL) 800 MG tablet Take 1 tablet (800 mg total) by mouth every 8 (eight) hours as needed. 30 tablet 0  . levothyroxine (SYNTHROID, LEVOTHROID) 125 MCG tablet Take 1 tablet (125 mcg total) by mouth daily before breakfast. 90 tablet 0  . promethazine (PHENERGAN) 25 MG tablet Take 1 tablet (25 mg total) by mouth every 8 (eight) hours as needed for nausea or vomiting. 20 tablet 0  . silver nitrate applicators 98-92 % applicator Apply 1 Stick topically daily.    Marland Kitchen sulfamethoxazole-trimethoprim (BACTRIM DS) 800-160 MG tablet Take 1 tablet by mouth 2 (two) times daily. 28 tablet 0  . TURMERIC PO Take by mouth.     No current facility-administered medications on file prior to visit.     Allergies  Allergen Reactions  . Keflex [Cephalexin] Rash    Objective: There were no vitals filed for this visit.  General: No acute distress,  AAOx3  Left foot: Sutures intact with no gapping or dehiscence at surgical site, puncture wound open measures >0.5cm with granular base to heal by secondary intention, mild swelling at arch, no erythema, no warmth, no drainage, no signs of infection noted, Capillary fill time <3 seconds in all digits, gross sensation present via light touch to left foot. No pain or crepitation with range of motion left foot but mild guarding at arch, numbness and shooting pain.  No pain with calf compression.   Assessment and Plan:  Problem List Items Addressed This Visit      Other   Puncture wound of left foot    Other Visit Diagnoses    S/P foot surgery, left    -  Primary   Left foot pain       Neuritis          -Patient seen and evaluated -Remaining sutures removed -Applied packing and dry sterile dressing to surgical site left foot secured with ACE wrap and stockinet  -Advised patient to continue with changing packing every other day  -Advised patient to continue with post-op shoe on left foot and crutches or cane -Advised patient to limit activity to necessity  -Advised patient to ice and elevate as necessary  -Continue with Bactrim per ID recs to end 12/31/18 -Will plan for wound check and discuss return to work at next office visit. In the  meantime, patient to call office if any issues or problems arise.   Landis Martins, DPM

## 2019-01-01 ENCOUNTER — Ambulatory Visit (INDEPENDENT_AMBULATORY_CARE_PROVIDER_SITE_OTHER): Payer: 59 | Admitting: Sports Medicine

## 2019-01-01 ENCOUNTER — Telehealth: Payer: Self-pay | Admitting: *Deleted

## 2019-01-01 ENCOUNTER — Encounter: Payer: Self-pay | Admitting: Sports Medicine

## 2019-01-01 ENCOUNTER — Other Ambulatory Visit: Payer: Self-pay

## 2019-01-01 VITALS — Temp 97.2°F

## 2019-01-01 DIAGNOSIS — S91332D Puncture wound without foreign body, left foot, subsequent encounter: Secondary | ICD-10-CM

## 2019-01-01 DIAGNOSIS — Z9889 Other specified postprocedural states: Secondary | ICD-10-CM

## 2019-01-01 DIAGNOSIS — M792 Neuralgia and neuritis, unspecified: Secondary | ICD-10-CM

## 2019-01-01 DIAGNOSIS — M79672 Pain in left foot: Secondary | ICD-10-CM

## 2019-01-01 NOTE — Progress Notes (Signed)
Subjective: Susan Cabrera is a 58 y.o. female patient seen today in office for POV #4 (DOS 12-03-18, S/P left foot ID and removal of foreign body with bone biopsy. Patient denies pain at surgical currently but still pain with light touch that is sharp with numbness at ball that is slowly getting better, denies calf pain, denies headache, chest pain, shortness of breath, nausea, vomiting, fever, or chills. Patient states that she has been putting dry dressing to area because could not get any packing to the area. Patient reports she is in a normal shoe and want to go back to work. No other issues noted.   Patient Active Problem List   Diagnosis Date Noted  . Puncture wound of left foot 11/29/2018  . Acute osteomyelitis ([place]) 11/29/2018  . Postablative hypothyroidism 10/07/2017    Current Outpatient Medications on File Prior to Visit  Medication Sig Dispense Refill  . APPLE CIDER VINEGAR PO Take by mouth.    . COCONUT OIL PO Take by mouth.    . COLLAGEN PO Take by mouth.    . Cyanocobalamin (B-12 PO) Take by mouth.    . docusate sodium (COLACE) 100 MG capsule Take 1 capsule (100 mg total) by mouth 2 (two) times daily. 10 capsule 0  . ibuprofen (ADVIL) 800 MG tablet Take 1 tablet (800 mg total) by mouth every 8 (eight) hours as needed. 30 tablet 0  . levothyroxine (SYNTHROID, LEVOTHROID) 125 MCG tablet Take 1 tablet (125 mcg total) by mouth daily before breakfast. 90 tablet 0  . silver nitrate applicators 59-56 % applicator Apply 1 Stick topically daily.    Marland Kitchen sulfamethoxazole-trimethoprim (BACTRIM DS) 800-160 MG tablet Take 1 tablet by mouth 2 (two) times daily. 28 tablet 0  . TURMERIC PO Take by mouth.     No current facility-administered medications on file prior to visit.     Allergies  Allergen Reactions  . Keflex [Cephalexin] Rash    Objective: There were no vitals filed for this visit.  General: No acute distress, AAOx3  Left foot: Wound healing well no depth less than 0.1cm  opening centrally with pinpoint granular base to heal by secondary intention, mild swelling at arch, no erythema, no warmth, no drainage, no signs of infection noted, Capillary fill time <3 seconds in all digits, gross sensation present via light touch to left foot. No pain or crepitation with range of motion left foot but mild guarding at arch, numbness and shooting pain like before that is slightly better.  No pain with calf compression.       Assessment and Plan:  Problem List Items Addressed This Visit      Other   Puncture wound of left foot    Other Visit Diagnoses    S/P foot surgery, left    -  Primary   Left foot pain       Neuritis          -Patient seen and evaluated -Applied medihoney and bandaid and advised patient to do the same daily -May use normal shoe -Advised patient to ice and elevate as necessary  -Continue with Bactrim per ID recs and will follow up to see if patient needs to continue antibiotics since her bone and tissue swab was negative -May resume work on Monday -Will plan for wound check in 2 weeks at next office visit. In the meantime, patient to call office if any issues or problems arise.   Landis Martins, DPM

## 2019-01-01 NOTE — Telephone Encounter (Signed)
Pt called for message from Dr. Cannon Kettle. I informed pt Dr. Cannon Kettle got recommendation from Infectious Disease, that pt should stop the antibiotic, monitor for signs of infection, if worsens begin the antibiotic again and call for an earlier appt.

## 2019-01-01 NOTE — Telephone Encounter (Signed)
Left message for pt to call for instructions from Dr. Cannon Kettle.

## 2019-01-01 NOTE — Telephone Encounter (Signed)
-----   Message from Landis Martins, [place] sent at 01/01/2019 12:59 PM EDT ----- Regarding: Stop Abx Val Will you let the patient know that ID said as below that she can stop her antibiotics and we can monitor for any signs of infection. If anything worsens she should resume antibiotics and return to office sooner. Thanks Dr. Cannon Kettle ----- Message ----- From: Golden Circle, FNP Sent: 01/01/2019  12:45 PM EDT To: Landis Martins, DPM  Hello Dr. Cannon Kettle. It appears that Susan Cabrera is doing well from your note and picture (thank you for that). Since she has completed 4 weeks of Bactrim I think it would be reasonable that she can stop at this time and continue to monitor for any signs of infection. Let me know if I can be of further assistance. Regards,Greg ----- Message ----- From: Landis Martins, DPM Sent: 01/01/2019  12:30 PM EDT To: Golden Circle, FNP  Do you think its safe to d/c antibiotics. Photo attached. Clinically the wound is doing well. -Dr. Cannon Kettle

## 2019-01-15 ENCOUNTER — Ambulatory Visit (INDEPENDENT_AMBULATORY_CARE_PROVIDER_SITE_OTHER): Payer: 59 | Admitting: Sports Medicine

## 2019-01-15 ENCOUNTER — Encounter: Payer: Self-pay | Admitting: Sports Medicine

## 2019-01-15 ENCOUNTER — Other Ambulatory Visit: Payer: Self-pay

## 2019-01-15 VITALS — Temp 97.8°F

## 2019-01-15 DIAGNOSIS — Z9889 Other specified postprocedural states: Secondary | ICD-10-CM

## 2019-01-15 DIAGNOSIS — M79672 Pain in left foot: Secondary | ICD-10-CM

## 2019-01-15 DIAGNOSIS — M792 Neuralgia and neuritis, unspecified: Secondary | ICD-10-CM

## 2019-01-15 DIAGNOSIS — S91332D Puncture wound without foreign body, left foot, subsequent encounter: Secondary | ICD-10-CM

## 2019-01-15 NOTE — Progress Notes (Signed)
Subjective: Susan Cabrera is a 58 y.o. female patient seen today in office for POV #5 (DOS 12-03-18, S/P left foot ID and removal of foreign body with bone biopsy. Patient denies pain at surgical but does admit swelling, back at work with no issues, denies calf pain, denies headache, chest pain, shortness of breath, nausea, vomiting, fever, or chills. Patient states that she has been putting medihoney and dry dressing to area to protect it when in shoes. No other issues noted.   Patient Active Problem List   Diagnosis Date Noted  . Puncture wound of left foot 11/29/2018  . Acute osteomyelitis ([place]) 11/29/2018  . Postablative hypothyroidism 10/07/2017    Current Outpatient Medications on File Prior to Visit  Medication Sig Dispense Refill  . APPLE CIDER VINEGAR PO Take by mouth.    . COCONUT OIL PO Take by mouth.    . COLLAGEN PO Take by mouth.    . Cyanocobalamin (B-12 PO) Take by mouth.    . docusate sodium (COLACE) 100 MG capsule Take 1 capsule (100 mg total) by mouth 2 (two) times daily. 10 capsule 0  . ibuprofen (ADVIL) 800 MG tablet Take 1 tablet (800 mg total) by mouth every 8 (eight) hours as needed. 30 tablet 0  . levothyroxine (SYNTHROID, LEVOTHROID) 125 MCG tablet Take 1 tablet (125 mcg total) by mouth daily before breakfast. 90 tablet 0  . silver nitrate applicators 24-09 % applicator Apply 1 Stick topically daily.    Marland Kitchen sulfamethoxazole-trimethoprim (BACTRIM DS) 800-160 MG tablet Take 1 tablet by mouth 2 (two) times daily. 28 tablet 0  . TURMERIC PO Take by mouth.    . valACYclovir (VALTREX) 1000 MG tablet TAKE 2 TABLETS BY MOUTH EVERY 12 HOURS FOR 2 DOSES     No current facility-administered medications on file prior to visit.     Allergies  Allergen Reactions  . Keflex [Cephalexin] Rash    Objective: There were no vitals filed for this visit.  General: No acute distress, AAOx3  Left foot: Wound healed, mild swelling at arch, no erythema, no warmth, no drainage, no  signs of infection noted, Capillary fill time <3 seconds in all digits, gross sensation present via light touch to left foot. No pain or crepitation with range of motion left foot but mild guarding at arch, numbness and shooting pain like before that is getting better.  No pain with calf compression.   Assessment and Plan:  Problem List Items Addressed This Visit      Other   Puncture wound of left foot    Other Visit Diagnoses    S/P foot surgery, left    -  Primary   Left foot pain       Neuritis          -Patient seen and evaluated -Applied bandaid and advised patient to do the same daily to protect it [place] -Continue with normal shoe -Advised patient to ice and elevate as necessary and to wear compression sleeve to help with edema control  -Bactrim completed  -Continue with normal work -Return PRN. In the meantime, patient to call office if any issues or problems arise.   Landis Martins, DPM

## 2019-01-21 ENCOUNTER — Other Ambulatory Visit: Payer: Self-pay | Admitting: Family Medicine

## 2019-01-25 NOTE — Telephone Encounter (Signed)
Referred pt to [facility]

## 2019-02-15 ENCOUNTER — Encounter: Payer: Self-pay | Admitting: Sports Medicine

## 2019-02-15 ENCOUNTER — Other Ambulatory Visit: Payer: Self-pay

## 2019-02-15 ENCOUNTER — Ambulatory Visit (INDEPENDENT_AMBULATORY_CARE_PROVIDER_SITE_OTHER): Payer: Self-pay | Admitting: Sports Medicine

## 2019-02-15 VITALS — Temp 97.7°F | Resp 16

## 2019-02-15 DIAGNOSIS — S91332D Puncture wound without foreign body, left foot, subsequent encounter: Secondary | ICD-10-CM

## 2019-02-15 DIAGNOSIS — L929 Granulomatous disorder of the skin and subcutaneous tissue, unspecified: Secondary | ICD-10-CM

## 2019-02-15 DIAGNOSIS — Z9889 Other specified postprocedural states: Secondary | ICD-10-CM

## 2019-02-15 DIAGNOSIS — M79672 Pain in left foot: Secondary | ICD-10-CM

## 2019-02-15 DIAGNOSIS — L02619 Cutaneous abscess of unspecified foot: Secondary | ICD-10-CM

## 2019-02-15 DIAGNOSIS — L03119 Cellulitis of unspecified part of limb: Secondary | ICD-10-CM

## 2019-02-15 MED ORDER — SULFAMETHOXAZOLE-TRIMETHOPRIM 800-160 MG PO TABS: 1.0000 | ORAL_TABLET | Freq: Two times a day (BID) | ORAL | 0 refills | Status: DC

## 2019-02-15 NOTE — Progress Notes (Signed)
Subjective: Donabelle Molden is a 58 y.o. female patient seen today in office for POV #6 (DOS 12-03-18, S/P left foot ID and removal of foreign body with bone biopsy.  Patient admits that the plantar incision opened back up on yesterday with a bubble of tissue and reports that she did notice to white balls coming out and reports that she has been covering area with Band-Aid reports some increased redness and swelling with pain over the top of the first metatarsal phalangeal joint that extends onto the metatarsal with most pain at the proximal aspect, denies calf pain, denies headache, chest pain, shortness of breath, nausea, vomiting, fever, or chills. No other issues noted.   Patient Active Problem List   Diagnosis Date Noted  . Puncture wound of left foot 11/29/2018  . Acute osteomyelitis ([place]) 11/29/2018  . Postablative hypothyroidism 10/07/2017    Current Outpatient Medications on File Prior to Visit  Medication Sig Dispense Refill  . APPLE CIDER VINEGAR PO Take by mouth.    . COCONUT OIL PO Take by mouth.    . COLLAGEN PO Take by mouth.    . Cyanocobalamin (B-12 PO) Take by mouth.    . docusate sodium (COLACE) 100 MG capsule Take 1 capsule (100 mg total) by mouth 2 (two) times daily. 10 capsule 0  . ibuprofen (ADVIL) 800 MG tablet Take 1 tablet (800 mg total) by mouth every 8 (eight) hours as needed. 30 tablet 0  . silver nitrate applicators 92-11 % applicator Apply 1 Stick topically daily.    Marland Kitchen SYNTHROID 125 MCG tablet TAKE 1 TABLET (125 MCG TOTAL) BY MOUTH DAILY BEFORE BREAKFAST. 90 tablet 0  . TURMERIC PO Take by mouth.    . valACYclovir (VALTREX) 1000 MG tablet TAKE 2 TABLETS BY MOUTH EVERY 12 HOURS FOR 2 DOSES     No current facility-administered medications on file prior to visit.     Allergies  Allergen Reactions  . Keflex [Cephalexin] Rash    Objective: There were no vitals filed for this visit.  General: No acute distress, AAOx3  Left foot: Hyper granular blood  extruding from the previous heel plantar arch incision site once debrided revealing a wound bed that measures 0.5 cm does not probe or tunnel, faint erythema, no warmth, no drainage, no signs of infection noted, Capillary fill time <3 seconds in all digits, gross sensation present via light touch to left foot.  Sharp pain with palpation to the first metatarsal at the proximal aspect and at the medial aspect extending into the arch at area of previous foreign body excision.  No pain with calf compression.   Assessment and Plan:  Problem List Items Addressed This Visit      Other   Puncture wound of left foot - Primary   Relevant Medications   sulfamethoxazole-trimethoprim (BACTRIM DS) 800-160 MG tablet   Other Relevant Orders   Dermatology pathology    Other Visit Diagnoses    S/P foot surgery, left       Left foot pain       Cellulitis and abscess of foot, except toes       Granuloma, skin          -Patient seen and evaluated -Patient declined a new set of x-rays at this visit -Using a sterile tissue nipper the granuloma was excised and sent to pathology as a specimen the wound bed was treated with silver nitrate and measures 0.5 x 0.3 cm does not probe or tunnel or communicate  to the medial dorsal aspect along the base of the first metatarsal.  Advised patient that this could likely represent a suture granuloma or a reoccurrence of infection -Advised patient to ice and elevate as necessary and to wear compression sleeve to help with edema control  -Bactrim refilled -Continue with normal work and close monitoring and advised patient if acute changes are noted to call office and that I will be even willing to come in on a Monday to check her foot or to do something more aggressive if it changes -Return as scheduled. In the meantime, patient to call office if any issues or problems arise.   Landis Martins, DPM

## 2019-02-18 ENCOUNTER — Encounter: Payer: Self-pay | Admitting: Sports Medicine

## 2019-02-19 ENCOUNTER — Encounter: Payer: Self-pay | Admitting: Sports Medicine

## 2019-02-19 ENCOUNTER — Other Ambulatory Visit: Payer: Self-pay

## 2019-02-19 ENCOUNTER — Ambulatory Visit (INDEPENDENT_AMBULATORY_CARE_PROVIDER_SITE_OTHER): Payer: 59 | Admitting: Sports Medicine

## 2019-02-19 VITALS — Temp 97.6°F

## 2019-02-19 DIAGNOSIS — L02612 Cutaneous abscess of left foot: Secondary | ICD-10-CM

## 2019-02-19 DIAGNOSIS — S91332D Puncture wound without foreign body, left foot, subsequent encounter: Secondary | ICD-10-CM

## 2019-02-19 DIAGNOSIS — L03032 Cellulitis of left toe: Secondary | ICD-10-CM | POA: Diagnosis not present

## 2019-02-19 DIAGNOSIS — S90852S Superficial foreign body, left foot, sequela: Secondary | ICD-10-CM

## 2019-02-19 DIAGNOSIS — M79672 Pain in left foot: Secondary | ICD-10-CM

## 2019-02-19 NOTE — Progress Notes (Signed)
Subjective: Susan Cabrera is a 58 y.o. female patient seen today in office for POV #7 (DOS 12-03-18, S/P left foot ID and removal of foreign body with bone biopsy.  Patient admits to increased redness and swelling with pain over the top of the first metatarsal phalangeal joint that extends onto the metatarsal with most pain at the proximal aspect that has worsened with a significant swollen bump over the area and is concerned because the area is very tender to touch and has worsened over the last day.  Patient is currently taking Bactrim with no problems.  Patient reports that she use a offloading pad around the area to try to prevent her shoe from rubbing, denies calf pain, denies headache, chest pain, shortness of breath, nausea, vomiting, fever, or chills. No other issues noted.   Patient Active Problem List   Diagnosis Date Noted  . Puncture wound of left foot 11/29/2018  . Acute osteomyelitis ([place]) 11/29/2018  . Postablative hypothyroidism 10/07/2017    Current Outpatient Medications on File Prior to Visit  Medication Sig Dispense Refill  . APPLE CIDER VINEGAR PO Take by mouth.    . COCONUT OIL PO Take by mouth.    . COLLAGEN PO Take by mouth.    . Cyanocobalamin (B-12 PO) Take by mouth.    . docusate sodium (COLACE) 100 MG capsule Take 1 capsule (100 mg total) by mouth 2 (two) times daily. 10 capsule 0  . ibuprofen (ADVIL) 800 MG tablet Take 1 tablet (800 mg total) by mouth every 8 (eight) hours as needed. 30 tablet 0  . silver nitrate applicators 41-93 % applicator Apply 1 Stick topically daily.    Marland Kitchen sulfamethoxazole-trimethoprim (BACTRIM DS) 800-160 MG tablet Take 1 tablet by mouth 2 (two) times daily. 28 tablet 0  . SYNTHROID 125 MCG tablet TAKE 1 TABLET (125 MCG TOTAL) BY MOUTH DAILY BEFORE BREAKFAST. 90 tablet 0  . TURMERIC PO Take by mouth.    . valACYclovir (VALTREX) 1000 MG tablet TAKE 2 TABLETS BY MOUTH EVERY 12 HOURS FOR 2 DOSES     No current facility-administered  medications on file prior to visit.     Allergies  Allergen Reactions  . Keflex [Cephalexin] Rash    Objective: There were no vitals filed for this visit.  General: No acute distress, AAOx3  Left foot: There is a swollen warm fluctuant mass noted at the proximal first metatarsal joint at the medial aspect of the first metatarsal base that is concerning for localized abscess versus residual foreign body that has migrated or has retained fragments from original puncture wound in the plantar surface of the foot with the stick piercing through to the dorsal medial surface of the foot.  The previous hyper granular plantar wound has been treated with silver nitrate and remains dry with no drainage, capillary fill time <3 seconds in all digits, gross sensation present via light touch to left foot.  Sharp pain with palpation to the first metatarsal at the proximal aspect and at the medial aspect extending into the arch at area of previous foreign body excision.  No pain with calf compression.   Assessment and Plan:  Problem List Items Addressed This Visit      Other   Puncture wound of left foot    Other Visit Diagnoses    Foreign body in left foot, sequela    -  Primary   Cellulitis and abscess of toe of left foot       Relevant Orders  WOUND CULTURE   Left foot pain          -Patient seen and evaluated -Patient declined a new set of x-rays at this visit -After verbal and aseptic prep a local field block was administered utilizing 9 cc of one-to-one mixture of 1% lidocaine plain and 0.5% Marcaine plain in a local field block fashion over the base of the first metatarsal at the medial aspect over the fluctuant area.  After the area was sterilely cleaned and then prepped anesthesia check was performed and then utilizing a 15 blade a stab incision over the fluctuant mass was made revealing clear to bloody fluid then following exploration was attempted using a pickup where there were multiple  splinter fragments of wood that were removed from the wound bed then wound culture was obtained and then the area was flushed and packed open utilizing Betadine soaked gauze and dry dressing.  Advised patient to keep dressing clean dry and intact for the day and may change on tomorrow using packing that she has at home advised patient to closely monitor for any worsening signs of infection and to continue with Bactrim.  Patient tolerated the procedure and local anesthesia well without complication and successful removal of wood foreign body splinters/fragments -Advised patient to call office if there are any acute signs of infection noted or to come in sooner to be checked or reevaluated -Return as scheduled. In the meantime, patient to call office if any issues or problems arise.   Landis Martins, DPM

## 2019-02-22 LAB — WOUND CULTURE
MICRO NUMBER:: 736169
RESULT:: NO GROWTH
SPECIMEN QUALITY:: ADEQUATE

## 2019-02-27 ENCOUNTER — Encounter: Payer: Self-pay | Admitting: Sports Medicine

## 2019-02-27 ENCOUNTER — Other Ambulatory Visit: Payer: Self-pay

## 2019-02-27 ENCOUNTER — Ambulatory Visit (INDEPENDENT_AMBULATORY_CARE_PROVIDER_SITE_OTHER): Payer: Self-pay | Admitting: Sports Medicine

## 2019-02-27 VITALS — Temp 97.0°F | Resp 16

## 2019-02-27 DIAGNOSIS — L03032 Cellulitis of left toe: Secondary | ICD-10-CM

## 2019-02-27 DIAGNOSIS — S91332D Puncture wound without foreign body, left foot, subsequent encounter: Secondary | ICD-10-CM

## 2019-02-27 DIAGNOSIS — M79672 Pain in left foot: Secondary | ICD-10-CM

## 2019-02-27 DIAGNOSIS — S90852S Superficial foreign body, left foot, sequela: Secondary | ICD-10-CM

## 2019-02-27 DIAGNOSIS — L02612 Cutaneous abscess of left foot: Secondary | ICD-10-CM

## 2019-02-27 NOTE — Progress Notes (Signed)
Subjective: Susan Cabrera is a 58 y.o. female patient seen today in office for POV #8 (DOS 12-03-18, S/P left foot ID and removal of foreign body with bone biopsy and a repeat in office I&D performed on 02/19/2019 with removal of more fragmented wood. Patient denies pain at surgical and reports that the top of her foot is feeling good with no residual pain at the secondary I&D site, denies calf pain, denies headache, chest pain, shortness of breath, nausea, vomiting, fever, or chills. Patient states that she has been putting medihoney and dry dressing to area to protect it when in shoes. No other issues noted.   Patient Active Problem List   Diagnosis Date Noted  . Puncture wound of left foot 11/29/2018  . Acute osteomyelitis ([place]) 11/29/2018  . Postablative hypothyroidism 10/07/2017    Current Outpatient Medications on File Prior to Visit  Medication Sig Dispense Refill  . APPLE CIDER VINEGAR PO Take by mouth.    . COCONUT OIL PO Take by mouth.    . COLLAGEN PO Take by mouth.    . Cyanocobalamin (B-12 PO) Take by mouth.    . docusate sodium (COLACE) 100 MG capsule Take 1 capsule (100 mg total) by mouth 2 (two) times daily. 10 capsule 0  . ibuprofen (ADVIL) 800 MG tablet Take 1 tablet (800 mg total) by mouth every 8 (eight) hours as needed. 30 tablet 0  . silver nitrate applicators 01-09 % applicator Apply 1 Stick topically daily.    Marland Kitchen sulfamethoxazole-trimethoprim (BACTRIM DS) 800-160 MG tablet Take 1 tablet by mouth 2 (two) times daily. 28 tablet 0  . SYNTHROID 125 MCG tablet TAKE 1 TABLET (125 MCG TOTAL) BY MOUTH DAILY BEFORE BREAKFAST. 90 tablet 0  . TURMERIC PO Take by mouth.    . valACYclovir (VALTREX) 1000 MG tablet TAKE 2 TABLETS BY MOUTH EVERY 12 HOURS FOR 2 DOSES     No current facility-administered medications on file prior to visit.     Allergies  Allergen Reactions  . Keflex [Cephalexin] Rash    Objective: There were no vitals filed for this visit.  General: No acute  distress, AAOx3  Left foot: Wound healed plantarly and new I&D site wound scabbed over, no swelling, no erythema, no warmth, no drainage, no signs of infection noted, Capillary fill time <3 seconds in all digits, gross sensation present via light touch to left foot. No pain or crepitation with range of motion, resolved shooting pain.  No pain with calf compression.   Assessment and Plan:  Problem List Items Addressed This Visit      Other   Puncture wound of left foot    Other Visit Diagnoses    Foreign body in left foot, sequela    -  Primary   Cellulitis and abscess of toe of left foot       Left foot pain          -Patient seen and evaluated -Wound and pathology culture results reviewed and are negative -Applied medihoney and bandaid and advised patient to do the same daily to protect it until I see her next visit -Continue with normal shoe -Advised patient to ice and elevate as necessary and to wear compression sleeve to help with edema control  -Bactrim to complete at least a 10-day course on Sunday -Continue with normal work -Return 2 to 4 weeks for a final check. In the meantime, patient to call office if any issues or problems arise.   Landis Martins, DPM

## 2019-02-28 ENCOUNTER — Encounter: Payer: Self-pay | Admitting: Sports Medicine

## 2019-03-05 ENCOUNTER — Ambulatory Visit: Payer: 59 | Admitting: Sports Medicine

## 2019-03-13 ENCOUNTER — Encounter: Payer: Self-pay | Admitting: Sports Medicine

## 2019-03-13 ENCOUNTER — Ambulatory Visit (INDEPENDENT_AMBULATORY_CARE_PROVIDER_SITE_OTHER): Payer: Self-pay | Admitting: Sports Medicine

## 2019-03-13 ENCOUNTER — Other Ambulatory Visit: Payer: Self-pay

## 2019-03-13 VITALS — Temp 98.3°F | Resp 16

## 2019-03-13 DIAGNOSIS — L03032 Cellulitis of left toe: Secondary | ICD-10-CM

## 2019-03-13 DIAGNOSIS — Z9889 Other specified postprocedural states: Secondary | ICD-10-CM

## 2019-03-13 DIAGNOSIS — M79672 Pain in left foot: Secondary | ICD-10-CM

## 2019-03-13 DIAGNOSIS — S91332D Puncture wound without foreign body, left foot, subsequent encounter: Secondary | ICD-10-CM

## 2019-03-13 DIAGNOSIS — S90852S Superficial foreign body, left foot, sequela: Secondary | ICD-10-CM

## 2019-03-13 DIAGNOSIS — L02612 Cutaneous abscess of left foot: Secondary | ICD-10-CM

## 2019-03-13 NOTE — Progress Notes (Signed)
Subjective: Susan Cabrera is a 58 y.o. female patient seen today in office for POV #9 (DOS 12-03-18, S/P left foot ID and removal of foreign body with bone biopsy and a repeat in office I&D performed on 02/19/2019 with removal of more fragmented wood. Patient denies pain at surgical site and reports that she is doing good no longer dressing area because it is healed. Patient admits numbness, Denies nausea, vomiting, fever, or chills. No other issues noted.   Patient Active Problem List   Diagnosis Date Noted  . Puncture wound of left foot 11/29/2018  . Acute osteomyelitis ([place]) 11/29/2018  . Postablative hypothyroidism 10/07/2017    Current Outpatient Medications on File Prior to Visit  Medication Sig Dispense Refill  . APPLE CIDER VINEGAR PO Take by mouth.    . COCONUT OIL PO Take by mouth.    . COLLAGEN PO Take by mouth.    . Cyanocobalamin (B-12 PO) Take by mouth.    . docusate sodium (COLACE) 100 MG capsule Take 1 capsule (100 mg total) by mouth 2 (two) times daily. 10 capsule 0  . ibuprofen (ADVIL) 800 MG tablet Take 1 tablet (800 mg total) by mouth every 8 (eight) hours as needed. 30 tablet 0  . silver nitrate applicators [id number] % applicator Apply 1 Stick topically daily.    Marland Kitchen sulfamethoxazole-trimethoprim (BACTRIM DS) 800-160 MG tablet Take 1 tablet by mouth 2 (two) times daily. 28 tablet 0  . SYNTHROID 125 MCG tablet TAKE 1 TABLET (125 MCG TOTAL) BY MOUTH DAILY BEFORE BREAKFAST. 90 tablet 0  . TURMERIC PO Take by mouth.    . valACYclovir (VALTREX) 1000 MG tablet TAKE 2 TABLETS BY MOUTH EVERY 12 HOURS FOR 2 DOSES     No current facility-administered medications on file prior to visit.     Allergies  Allergen Reactions  . Keflex [Cephalexin] Rash    Objective: There were no vitals filed for this visit.  General: No acute distress, AAOx3  Left foot: Wound healed plantarly and new I&D site wound is well-healed with minimal scabbing, no swelling, no erythema, no warmth, no  drainage, no signs of infection noted, Capillary fill time <3 seconds in all digits, gross sensation present via light touch to left foot. No pain or crepitation with range of motion, resolved shooting pain activity at the dorsal first metatarsal. Numbness plantar forefoot on left. No pain with calf compression.   Assessment and Plan:  Problem List Items Addressed This Visit      Other   Puncture wound of left foot    Other Visit Diagnoses    Foreign body in left foot, sequela    -  Primary   Cellulitis and abscess of toe of left foot       Left foot pain       S/P foot surgery, left          -Patient seen and evaluated -I&D wound site well-healed -Advised patient to closely monitor left foot if there is any opening or symptoms that recur patient to return to office -Return as needed.  In the meantime, patient to call office if any issues or problems arise.   Landis Martins, DPM

## 2019-05-31 ENCOUNTER — Other Ambulatory Visit: Payer: Self-pay

## 2019-05-31 ENCOUNTER — Encounter: Payer: Self-pay | Admitting: Nurse Practitioner

## 2019-05-31 ENCOUNTER — Ambulatory Visit (INDEPENDENT_AMBULATORY_CARE_PROVIDER_SITE_OTHER): Payer: 59 | Admitting: Nurse Practitioner

## 2019-05-31 VITALS — BP 132/86 | Temp 97.9°F | Ht 60.0 in | Wt 138.8 lb

## 2019-05-31 DIAGNOSIS — R5383 Other fatigue: Secondary | ICD-10-CM

## 2019-05-31 DIAGNOSIS — Z131 Encounter for screening for diabetes mellitus: Secondary | ICD-10-CM

## 2019-05-31 DIAGNOSIS — M25511 Pain in right shoulder: Secondary | ICD-10-CM

## 2019-05-31 DIAGNOSIS — Z0000 Encounter for general adult medical examination without abnormal findings: Secondary | ICD-10-CM

## 2019-05-31 DIAGNOSIS — E890 Postprocedural hypothyroidism: Secondary | ICD-10-CM

## 2019-05-31 DIAGNOSIS — Z13220 Encounter for screening for lipoid disorders: Secondary | ICD-10-CM | POA: Diagnosis not present

## 2019-05-31 DIAGNOSIS — Z1329 Encounter for screening for other suspected endocrine disorder: Secondary | ICD-10-CM

## 2019-05-31 MED ORDER — VALACYCLOVIR HCL 1 G PO TABS: ORAL_TABLET | ORAL | 2 refills | Status: DC

## 2019-05-31 NOTE — Progress Notes (Signed)
   Subjective:    Patient ID: Susan Cabrera, female    DOB: [birth year], 58 y.o.   MRN: [id number]  HPI The patient comes in today for a wellness visit.    A review of their health history was completed.  A review of medications was also completed.  Any needed refills; Thyroid  Eating habits: healthy  Falls/  MVA accidents in past few months: none  Regular exercise: moves constantly at work   Specialist pt sees on regular basis: GYN  Preventative health issues were discussed.   Additional concerns: pt would like lab work   Review of Systems     Objective:   Physical Exam        Assessment & Plan:

## 2019-05-31 NOTE — Progress Notes (Signed)
Subjective:    Patient ID: Susan Cabrera, female    DOB: 1960-07-21, 58 y.o.   MRN: [id number]  HPI The patient comes in today for a wellness visit. Pt is eating healthy. 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 She sleeps about 8 hours per day, but still reports feeling tired. Pt has same sexual partner. Regular eye and dental exam. Pt reports colonoscopy up to date. Pt had a mammogram and pap smear in spring 2020 by GYN, results normal. Pt received bone density scan through GYN, results revealed slight osteopenia. Pt inquiring about Shingrix vaccine. Received flu vaccine through work.   Pt also complaining about right shoulder pain. It has been ongoing for several months. Pt states it feel tenderness and pain in shoulder. No weakness or numbness. 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 Pt has not tried anything for it. It is worse when she sits with her arm hanging down.  A review of their health history was completed. A review of medications was also completed.  Any needed refills; Thyroid  Eating habits: healthy  Falls/  MVA accidents in past few months: none  Specialist pt sees on regular basis: GYN  Preventative health issues were discussed.   Additional concerns: pt would like lab work   Review of Systems  Constitutional: Positive for fatigue. Negative for activity change, appetite change, chills and fever.  HENT: Negative for sinus pain and sneezing.        Positive for ear ache  Respiratory: Negative for cough, shortness of breath and wheezing.   Cardiovascular: Negative for chest pain, palpitations and leg swelling.  Gastrointestinal: Negative for abdominal pain, blood in stool, constipation, diarrhea and vomiting.  Genitourinary: Negative for difficulty urinating, dysuria, frequency, genital sores, pelvic pain, vaginal bleeding and vaginal discharge.  Musculoskeletal: Negative for back pain and myalgias.       Positive for  shoulder pain  Neurological: Negative for dizziness, syncope, weakness and light-headedness.  Psychiatric/Behavioral: Negative for confusion and sleep disturbance.     Depression screen [facility] 2/9 05/31/2019 12/06/2018 11/29/2018 10/06/2017  Decreased Interest 0 0 0 0  Down, Depressed, Hopeless 0 0 0 0  PHQ - 2 Score 0 0 0 0         Objective:   Physical Exam Constitutional:      Appearance: Normal appearance.  HENT:     Right Ear: No drainage or tenderness. There is no impacted cerumen. No mastoid tenderness. Tympanic membrane is retracted. Tympanic membrane is not erythematous.     Left Ear: No drainage or tenderness. There is no impacted cerumen. No mastoid tenderness. Tympanic membrane is retracted. Tympanic membrane is not erythematous.  Neck:     Thyroid: No thyroid mass or thyromegaly.     Comments: Mild tenderness and tightness along the right lower face as compared to the right.  Cardiovascular:     Rate and Rhythm: Normal rate and regular rhythm.     Heart sounds: No murmur.  Pulmonary:     Effort: No respiratory distress.     Breath sounds: Normal breath sounds. No stridor. No wheezing or rhonchi.  Chest:     Chest wall: No mass, lacerations, swelling or tenderness.     Comments: Defers breast exam Abdominal:     General: Abdomen is flat.     Palpations: There is no mass.     Tenderness: There is no abdominal tenderness. There is no rebound.  Hernia: No hernia is present.  Genitourinary:    Comments: Defers GYN exam Musculoskeletal:     Comments: Normal ROM of both shoulder. Some tenderness/pain with movement of the right shoulder (mostly with internal and external rotation). Tenderness of right shoulder with palpation, especially near anterior AC joint. Normal strength bilaterally. No tenderness with ROM of elbows bilaterally. No tenderness with ROM of wrists bilaterally. Heberden's nodes present in fingers bilaterally.  Lymphadenopathy:     Cervical: No cervical  adenopathy.  Skin:    General: Skin is warm and dry.  Neurological:     Mental Status: She is alert and oriented to person, place, and time.  Psychiatric:        Mood and Affect: Mood normal.        Behavior: Behavior normal.           Assessment & Plan:   Problem List Items Addressed This Visit      Endocrine   Postablative hypothyroidism   Relevant Orders   CBC with Differential   Lipid Profile   Vitamin D (25 hydroxy)   COMPLETE METABOLIC PANEL WITH GFR   TSH   Hemoglobin A1c    Other Visit Diagnoses    Wellness examination    -  Primary   Relevant Orders   CBC with Differential   Lipid Profile   Vitamin D (25 hydroxy)   COMPLETE METABOLIC PANEL WITH GFR   TSH   Hemoglobin A1c   Screening, lipid       Relevant Orders   CBC with Differential   Lipid Profile   Vitamin D (25 hydroxy)   COMPLETE METABOLIC PANEL WITH GFR   TSH   Hemoglobin A1c   Screening for thyroid disorder       Relevant Orders   CBC with Differential   Lipid Profile   Vitamin D (25 hydroxy)   COMPLETE METABOLIC PANEL WITH GFR   TSH   Hemoglobin A1c   Encounter for screening examination for impaired glucose regulation and diabetes mellitus       Relevant Orders   CBC with Differential   Lipid Profile   Vitamin D (25 hydroxy)   COMPLETE METABOLIC PANEL WITH GFR   TSH   Hemoglobin A1c   Fatigue, unspecified type       Right shoulder pain, unspecified chronicity          -Recommend continue healthy lifestyle and dietary habits. Recommend Shingrix vaccine through local pharmacy. Educated pt about bone density every 2 years-up to date per patient. Pt taking calcium and vitamin D daily. Pt colonoscopy up to date, receiving them every 10 years. Mammogram and pap smears up to date. Recommend Flonase/antihistamine for congestion. Shoulder pain is most likely shoulder tendinosis/tendinopathy. Recommend stretching exercises, NSAIDs, massage therapy, Voltaren gel, or lidocaine patches;  information provide in AVS. If no improvement of shoulder pain in 2 weeks or if worsening, will plan for x-ray. Gentle massage to right facial area. Discussed stress reduction.  Return in about 1 year (around 05/30/2020) for physical.

## 2019-05-31 NOTE — Patient Instructions (Signed)
-Voltaren gel -Lidocaine Patch -NSAIDs -Shoulder excercises -Tens Unit Shoulder Exercises Ask your health care provider which exercises are safe for you. Do exercises exactly as told by your health care provider and adjust them as directed. It is normal to feel mild stretching, pulling, tightness, or discomfort as you do these exercises. Stop right away if you feel sudden pain or your pain gets worse. Do not begin these exercises until told by your health care provider. Stretching exercises External rotation and abduction This exercise is sometimes called corner stretch. This exercise rotates your arm outward (external rotation) and moves your arm out from your body (abduction). 1. Stand in a doorway with one of your feet slightly in front of the other. This is called a staggered stance. If you cannot reach your forearms to the door frame, stand facing a corner of a room. 2. Choose one of the following positions as told by your health care provider: ? Place your hands and forearms on the door frame above your head. ? Place your hands and forearms on the door frame at the height of your head. ? Place your hands on the door frame at the height of your elbows. 3. Slowly move your weight onto your front foot until you feel a stretch across your chest and in the front of your shoulders. Keep your head and chest upright and keep your abdominal muscles tight. 4. Hold for __________ seconds. 5. To release the stretch, shift your weight to your back foot. Repeat __________ times. Complete this exercise __________ times a day. Extension, standing 1. Stand and hold a broomstick, a cane, or a similar object behind your back. ? Your hands should be a little wider than shoulder width apart. ? Your palms should face away from your back. 2. Keeping your elbows straight and your shoulder muscles relaxed, move the stick away from your body until you feel a stretch in your shoulders (extension). ? Avoid  shrugging your shoulders while you move the stick. Keep your shoulder blades tucked down toward the middle of your back. 3. Hold for __________ seconds. 4. Slowly return to the starting position. Repeat __________ times. Complete this exercise __________ times a day. Range-of-motion exercises Pendulum  1. Stand near a wall or a surface that you can hold onto for balance. 2. Bend at the waist and let your left / right arm hang straight down. Use your other arm to support you. Keep your back straight and do not lock your knees. 3. Relax your left / right arm and shoulder muscles, and move your hips and your trunk so your left / right arm swings freely. Your arm should swing because of the motion of your body, not because you are using your arm or shoulder muscles. 4. Keep moving your hips and trunk so your arm swings in the following directions, as told by your health care provider: ? Side to side. ? Forward and backward. ? In clockwise and counterclockwise circles. 5. Continue each motion for __________ seconds, or for as long as told by your health care provider. 6. Slowly return to the starting position. Repeat __________ times. Complete this exercise __________ times a day. Shoulder flexion, standing  1. Stand and hold a broomstick, a cane, or a similar object. Place your hands a little more than shoulder width apart on the object. Your left / right hand should be palm up, and your other hand should be palm down. 2. Keep your elbow straight and your shoulder muscles relaxed.  Push the stick up with your healthy arm to raise your left / right arm in front of your body, and then over your head until you feel a stretch in your shoulder (flexion). ? Avoid shrugging your shoulder while you raise your arm. Keep your shoulder blade tucked down toward the middle of your back. 3. Hold for __________ seconds. 4. Slowly return to the starting position. Repeat __________ times. Complete this exercise  __________ times a day. Shoulder abduction, standing 1. Stand and hold a broomstick, a cane, or a similar object. Place your hands a little more than shoulder width apart on the object. Your left / right hand should be palm up, and your other hand should be palm down. 2. Keep your elbow straight and your shoulder muscles relaxed. Push the object across your body toward your left / right side. Raise your left / right arm to the side of your body (abduction) until you feel a stretch in your shoulder. ? Do not raise your arm above shoulder height unless your health care provider tells you to do that. ? If directed, raise your arm over your head. ? Avoid shrugging your shoulder while you raise your arm. Keep your shoulder blade tucked down toward the middle of your back. 3. Hold for __________ seconds. 4. Slowly return to the starting position. Repeat __________ times. Complete this exercise __________ times a day. Internal rotation  1. Place your left / right hand behind your back, palm up. 2. Use your other hand to dangle an exercise band, a towel, or a similar object over your shoulder. Grasp the band with your left / right hand so you are holding on to both ends. 3. Gently pull up on the band until you feel a stretch in the front of your left / right shoulder. The movement of your arm toward the center of your body is called internal rotation. ? Avoid shrugging your shoulder while you raise your arm. Keep your shoulder blade tucked down toward the middle of your back. 4. Hold for __________ seconds. 5. Release the stretch by letting go of the band and lowering your hands. Repeat __________ times. Complete this exercise __________ times a day. Strengthening exercises External rotation  1. Sit in a stable chair without armrests. 2. Secure an exercise band to a stable object at elbow height on your left / right side. 3. Place a soft object, such as a folded towel or a small pillow, between your  left / right upper arm and your body to move your elbow about 4 inches (10 cm) away from your side. 4. Hold the end of the exercise band so it is tight and there is no slack. 5. Keeping your elbow pressed against the soft object, slowly move your forearm out, away from your abdomen (external rotation). Keep your body steady so only your forearm moves. 6. Hold for __________ seconds. 7. Slowly return to the starting position. Repeat __________ times. Complete this exercise __________ times a day. Shoulder abduction  1. Sit in a stable chair without armrests, or stand up. 2. Hold a __________ weight in your left / right hand, or hold an exercise band with both hands. 3. Start with your arms straight down and your left / right palm facing in, toward your body. 4. Slowly lift your left / right hand out to your side (abduction). Do not lift your hand above shoulder height unless your health care provider tells you that this is safe. ? Keep your  arms straight. ? Avoid shrugging your shoulder while you do this movement. Keep your shoulder blade tucked down toward the middle of your back. 5. Hold for __________ seconds. 6. Slowly lower your arm, and return to the starting position. Repeat __________ times. Complete this exercise __________ times a day. Shoulder extension 1. Sit in a stable chair without armrests, or stand up. 2. Secure an exercise band to a stable object in front of you so it is at shoulder height. 3. Hold one end of the exercise band in each hand. Your palms should face each other. 4. Straighten your elbows and lift your hands up to shoulder height. 5. Step back, away from the secured end of the exercise band, until the band is tight and there is no slack. 6. Squeeze your shoulder blades together as you pull your hands down to the sides of your thighs (extension). Stop when your hands are straight down by your sides. Do not let your hands go behind your body. 7. Hold for __________  seconds. 8. Slowly return to the starting position. Repeat __________ times. Complete this exercise __________ times a day. Shoulder row 1. Sit in a stable chair without armrests, or stand up. 2. Secure an exercise band to a stable object in front of you so it is at waist height. 3. Hold one end of the exercise band in each hand. Position your palms so that your thumbs are facing the ceiling (neutral position). 4. Bend each of your elbows to a 90-degree angle (right angle) and keep your upper arms at your sides. 5. Step back until the band is tight and there is no slack. 6. Slowly pull your elbows back behind you. 7. Hold for __________ seconds. 8. Slowly return to the starting position. Repeat __________ times. Complete this exercise __________ times a day. Shoulder press-ups  1. Sit in a stable chair that has armrests. Sit upright, with your feet flat on the floor. 2. Put your hands on the armrests so your elbows are bent and your fingers are pointing forward. Your hands should be about even with the sides of your body. 3. Push down on the armrests and use your arms to lift yourself off the chair. Straighten your elbows and lift yourself up as much as you comfortably can. ? Move your shoulder blades down, and avoid letting your shoulders move up toward your ears. ? Keep your feet on the ground. As you get stronger, your feet should support less of your body weight as you lift yourself up. 4. Hold for __________ seconds. 5. Slowly lower yourself back into the chair. Repeat __________ times. Complete this exercise __________ times a day. Wall push-ups  1. Stand so you are facing a stable wall. Your feet should be about one arm-length away from the wall. 2. Lean forward and place your palms on the wall at shoulder height. 3. Keep your feet flat on the floor as you bend your elbows and lean forward toward the wall. 4. Hold for __________ seconds. 5. Straighten your elbows to push yourself  back to the starting position. Repeat __________ times. Complete this exercise __________ times a day. This information is not intended to replace advice given to you by your health care provider. Make sure you discuss any questions you have with your health care provider. Document Released: 05/18/2005 Document Revised: 10/26/2018 Document Reviewed: 08/03/2018 Elsevier Patient Education  2020 [employer].

## 2019-06-06 LAB — VITAMIN D 25 HYDROXY (VIT D DEFICIENCY, FRACTURES): Vit D, 25-Hydroxy: 38.6 ng/mL (ref 30.0–100.0)

## 2019-06-06 LAB — LIPID PANEL
Chol/HDL Ratio: 3 ratio (ref 0.0–4.4)
Cholesterol, Total: 225 mg/dL — ABNORMAL HIGH (ref 100–199)
HDL: 74 mg/dL (ref >39)
LDL Chol Calc (NIH): 121 mg/dL — ABNORMAL HIGH (ref 0–99)
Triglycerides: 175 mg/dL — ABNORMAL HIGH (ref 0–149)
VLDL Cholesterol Cal: 30 mg/dL (ref 5–40)

## 2019-06-06 LAB — CBC WITH DIFFERENTIAL/PLATELET
Basophils Absolute: 0 10*3/uL (ref 0.0–0.2)
Basos: 1 %
EOS (ABSOLUTE): 0.1 10*3/uL (ref 0.0–0.4)
Eos: 3 %
Hematocrit: 40.3 % (ref 34.0–46.6)
Hemoglobin: 13.7 g/dL (ref 11.1–15.9)
Immature Grans (Abs): 0 10*3/uL (ref 0.0–0.1)
Immature Granulocytes: 0 %
Lymphocytes Absolute: 1.8 10*3/uL (ref 0.7–3.1)
Lymphs: 37 %
MCH: 30.8 pg (ref 26.6–33.0)
MCHC: 34 g/dL (ref 31.5–35.7)
MCV: 91 fL (ref 79–97)
Monocytes Absolute: 0.4 10*3/uL (ref 0.1–0.9)
Monocytes: 7 %
Neutrophils Absolute: 2.6 10*3/uL (ref 1.4–7.0)
Neutrophils: 52 %
Platelets: 312 10*3/uL (ref 150–450)
RBC: 4.45 x10E6/uL (ref 3.77–5.28)
RDW: 12.1 % (ref 11.7–15.4)
WBC: 4.9 10*3/uL (ref 3.4–10.8)

## 2019-06-06 LAB — HEMOGLOBIN A1C
Est. average glucose Bld gHb Est-mCnc: 111 mg/dL
Hgb A1c MFr Bld: 5.5 % (ref 4.8–5.6)

## 2019-06-06 LAB — TSH: TSH: 0.116 u[IU]/mL — ABNORMAL LOW (ref 0.450–4.500)

## 2019-06-07 ENCOUNTER — Other Ambulatory Visit: Payer: Self-pay | Admitting: Nurse Practitioner

## 2019-06-07 DIAGNOSIS — E890 Postprocedural hypothyroidism: Secondary | ICD-10-CM

## 2019-06-07 MED ORDER — LEVOTHYROXINE SODIUM 112 MCG PO TABS: 112.0000 ug | ORAL_TABLET | Freq: Every day | ORAL | 0 refills | Status: DC

## 2019-07-23 ENCOUNTER — Encounter: Payer: Self-pay | Admitting: Nurse Practitioner

## 2019-07-24 ENCOUNTER — Encounter: Payer: Self-pay | Admitting: Nurse Practitioner

## 2019-08-21 ENCOUNTER — Encounter: Payer: Self-pay | Admitting: Family Medicine

## 2019-08-26 ENCOUNTER — Other Ambulatory Visit: Payer: Self-pay | Admitting: Nurse Practitioner

## 2019-08-26 DIAGNOSIS — E890 Postprocedural hypothyroidism: Secondary | ICD-10-CM

## 2019-11-21 ENCOUNTER — Other Ambulatory Visit: Payer: Self-pay | Admitting: Family Medicine

## 2019-11-21 DIAGNOSIS — E890 Postprocedural hypothyroidism: Secondary | ICD-10-CM

## 2019-11-21 NOTE — Telephone Encounter (Signed)
Wellness 05/31/19. Dose was changed that month on bw results. Tsh order was put in that is active. Not sure when pt was suppose to repeat. Did not see it in the note.

## 2020-01-08 ENCOUNTER — Encounter: Payer: Self-pay | Admitting: Sports Medicine

## 2020-01-08 ENCOUNTER — Ambulatory Visit (INDEPENDENT_AMBULATORY_CARE_PROVIDER_SITE_OTHER): Payer: 59 | Admitting: Sports Medicine

## 2020-01-08 ENCOUNTER — Other Ambulatory Visit: Payer: Self-pay

## 2020-01-08 DIAGNOSIS — S90852A Superficial foreign body, left foot, initial encounter: Secondary | ICD-10-CM

## 2020-01-08 DIAGNOSIS — M79672 Pain in left foot: Secondary | ICD-10-CM

## 2020-01-08 DIAGNOSIS — Z9889 Other specified postprocedural states: Secondary | ICD-10-CM

## 2020-01-08 MED ORDER — SULFAMETHOXAZOLE-TRIMETHOPRIM 400-80 MG PO TABS: 1.0000 | ORAL_TABLET | Freq: Two times a day (BID) | ORAL | 0 refills | Status: DC

## 2020-01-08 NOTE — Progress Notes (Signed)
Subjective: Susan Cabrera is a 59 y.o. female patient seen today in office for follow-up evaluation of left foot pain.  Patient is status post repeat in office I&D on the left foot performed 02/19/2019.  Patient reports that she has noticed a little bit of swelling and some sharp pain over the medial aspect of her left foot adjacent to the area of previous removal of wood fragments.  Patient reports that there is some pain with shoes but no significant redness warmth swelling but it is sometimes sore and tender and is concerned that maybe possibly something else is returning as far as a piece of wood or issues and wanted to have her foot checked.  Patient denies nausea vomiting fever chills or any other constitutional symptoms at this time.  Admits to still numbness to her toes that is slowly improving.  Patient denies any changes with medical history since last encounter.  No other issues noted.   Patient Active Problem List   Diagnosis Date Noted  . Puncture wound of left foot 11/29/2018  . Acute osteomyelitis ([place]) 11/29/2018  . Postablative hypothyroidism 10/07/2017    Current Outpatient Medications on File Prior to Visit  Medication Sig Dispense Refill  . APPLE CIDER VINEGAR PO Take by mouth.    . COCONUT OIL PO Take by mouth.    . COLLAGEN PO Take by mouth.    . Cyanocobalamin (B-12 PO) Take by mouth.    . docusate sodium (COLACE) 100 MG capsule Take 1 capsule (100 mg total) by mouth 2 (two) times daily. 10 capsule 0  . ibuprofen (ADVIL) 800 MG tablet Take 1 tablet (800 mg total) by mouth every 8 (eight) hours as needed. 30 tablet 0  . levothyroxine (SYNTHROID) 112 MCG tablet TAKE 1 TABLET (112 MCG TOTAL) BY MOUTH DAILY BEFORE BREAKFAST. 90 tablet 0  . silver nitrate applicators 30-16 % applicator Apply 1 Stick topically daily.    . TURMERIC PO Take by mouth.    . valACYclovir (VALTREX) 1000 MG tablet TAKE 2 TABLETS BY MOUTH EVERY 12 HOURS FOR 2 DOSES 4 tablet 2   No current  facility-administered medications on file prior to visit.    Allergies  Allergen Reactions  . Keflex [Cephalexin] Rash    Objective: There were no vitals filed for this visit.  General: No acute distress, AAOx3  Left foot: Previous surgical sites well-healed.  There is a palpable raised area noted to the medial foot at area adjacent to previous I&D site likely possible scar tissue versus retained foreign body however due to patient history concerning more for retained foreign body since patient had a history of fluid in her foot.  No erythema no warmth no drainage no acute signs of infection.  Capillary fill time intact all toes DP and PT pedal pulses intact.  Numbness to plantar forefoot that is slowly improving but still present.  No pain with palpation to ankle or calf.  Range of motion acceptable for patient status with subjective sharp pain over the medial foot at the area adjacent to the previous I&D site.  Assessment and Plan:  Problem List Items Addressed This Visit    None    Visit Diagnoses    Foreign body in left foot, initial encounter    -  Primary   Left foot pain       S/P foot surgery, left          -Patient seen and evaluated -Patient declined new set of x-rays at this  visit -Discussed with patient likelihood of possible scar tissue versus retained foreign body of left foot with history previously of having wood splinters in the same foot in the same area.  After verbal consent for I&D versus exploration for foreign body left foot and aseptic prep a local injection consisting of 3 cc of lidocaine and Marcaine plain was injected in a local field block fashion to the medial foot at area of previous I&D site once the area was totally anesthetized it was prepped using Betadine then anesthesia was confirmed using a 1 to pick up after confirmation and total anesthesia achieved using a 15 blade a small stab incision was made over the area of concern through skin and subcutaneous  tissue then utilizing a pickup the small puncture wound was investigated with no residual wood fragments noted the area was then cauterized and hemostasis was achieved using Lumicain and applied antibiotic cream to the surrounding skin covered with nonstick gauze 4 x 4 and Coban compression advised patient to continue with the same daily until the area heals and to closely monitor for any foreign bodies that may work its way to the surface of this puncture wound.  Patient tolerated procedure and local anesthesia well without complication. -Bactrim was prescribed for preventative measures -Advised for supportive shoes rest ice elevation and Tylenol or Motrin as needed for pain control -Return in 1 week for follow-up evaluation of incision and drainage site.  In the meantime, patient to call office if any issues or problems arise.   Landis Martins, DPM

## 2020-01-15 ENCOUNTER — Ambulatory Visit (INDEPENDENT_AMBULATORY_CARE_PROVIDER_SITE_OTHER): Payer: 59 | Admitting: Sports Medicine

## 2020-01-15 ENCOUNTER — Other Ambulatory Visit: Payer: Self-pay

## 2020-01-15 DIAGNOSIS — Z9889 Other specified postprocedural states: Secondary | ICD-10-CM

## 2020-01-15 DIAGNOSIS — S90852D Superficial foreign body, left foot, subsequent encounter: Secondary | ICD-10-CM

## 2020-01-15 DIAGNOSIS — M79672 Pain in left foot: Secondary | ICD-10-CM

## 2020-01-15 NOTE — Progress Notes (Signed)
Subjective: Susan Cabrera is a 59 y.o. female patient seen today in office for I&D wound site check.  Patient had incision and drainage procedure performed in office on last week at area of concern at left medial midfoot.  Patient reports that his been doing good no pain no redness no swelling no drainage has been keeping antibiotic cream and Band-Aid to the area and has a few tablets of Bactrim left.  Patient admits numbness unchanged from prior but no pain, Denies nausea, vomiting, fever, or chills. No other issues noted.   Patient Active Problem List   Diagnosis Date Noted  . Puncture wound of left foot 11/29/2018  . Acute osteomyelitis ([place]) 11/29/2018  . Postablative hypothyroidism 10/07/2017    Current Outpatient Medications on File Prior to Visit  Medication Sig Dispense Refill  . APPLE CIDER VINEGAR PO Take by mouth.    . COCONUT OIL PO Take by mouth.    . COLLAGEN PO Take by mouth.    . Cyanocobalamin (B-12 PO) Take by mouth.    . docusate sodium (COLACE) 100 MG capsule Take 1 capsule (100 mg total) by mouth 2 (two) times daily. 10 capsule 0  . ibuprofen (ADVIL) 800 MG tablet Take 1 tablet (800 mg total) by mouth every 8 (eight) hours as needed. 30 tablet 0  . levothyroxine (SYNTHROID) 112 MCG tablet TAKE 1 TABLET (112 MCG TOTAL) BY MOUTH DAILY BEFORE BREAKFAST. 90 tablet 0  . silver nitrate applicators 08-02 % applicator Apply 1 Stick topically daily.    Marland Kitchen sulfamethoxazole-trimethoprim (BACTRIM) 400-80 MG tablet Take 1 tablet by mouth 2 (two) times daily. 14 tablet 0  . TURMERIC PO Take by mouth.    . valACYclovir (VALTREX) 1000 MG tablet TAKE 2 TABLETS BY MOUTH EVERY 12 HOURS FOR 2 DOSES 4 tablet 2   No current facility-administered medications on file prior to visit.    Allergies  Allergen Reactions  . Keflex [Cephalexin] Rash    Objective: There were no vitals filed for this visit.  General: No acute distress, AAOx3  Left foot: I&D site wound appears to be healing  very well with no acute signs of infection no swelling no warmth no drainage no erythema no edema capillary fill time intact all digits continued numbness unchanged from prior to the plantar medial and lateral forefoot as well as toes.  No pain with range of motion.    Assessment and Plan:  Problem List Items Addressed This Visit    None    Visit Diagnoses    S/P foot surgery, left    -  Primary   Left foot pain       Foreign body in left foot, subsequent encounter          -Patient seen and evaluated -I&D wound site healing well uneventfully -Advised patient to closely monitor left foot if there is any opening or symptoms that recur patient to return to office -Continue with good supportive shoes daily for foot type -Return as needed.  In the meantime, patient to call office if any issues or problems arise.   Landis Martins, DPM

## 2020-02-14 ENCOUNTER — Telehealth: Payer: Self-pay | Admitting: Family Medicine

## 2020-02-14 DIAGNOSIS — Z13220 Encounter for screening for lipoid disorders: Secondary | ICD-10-CM

## 2020-02-14 DIAGNOSIS — Z0000 Encounter for general adult medical examination without abnormal findings: Secondary | ICD-10-CM

## 2020-02-14 DIAGNOSIS — Z79899 Other long term (current) drug therapy: Secondary | ICD-10-CM

## 2020-02-14 NOTE — Telephone Encounter (Signed)
Have pt go to get labs, needs to have thyroid rechecked, since it was low last time.  And make appt to review labs and est care after that.   Thx,   Dr. Lovena Le

## 2020-02-14 NOTE — Telephone Encounter (Signed)
Pt states she can go anytime and have lab work. Pt does not need refill on med at this time. Please advise. Thank you

## 2020-02-14 NOTE — Addendum Note (Signed)
Addended by: Vicente Males on: 02/14/2020 03:00 PM   Modules accepted: Orders

## 2020-02-14 NOTE — Telephone Encounter (Signed)
Needs labs, not done last November. When can she go?  Dr. Lovena Le

## 2020-02-14 NOTE — Telephone Encounter (Signed)
Left message to return call 

## 2020-02-14 NOTE — Telephone Encounter (Signed)
Thyroid labs already in computer, but you can add cbc, cmp, and lipid panel.  Thx.  Dr. Lovena Le

## 2020-02-14 NOTE — Telephone Encounter (Signed)
What labs should we order? Just thyroid? Please advise. Thank you

## 2020-02-14 NOTE — Telephone Encounter (Signed)
Lab orders mailed to patient with instructions to have labs done and also call office to set up appt

## 2020-02-14 NOTE — Telephone Encounter (Signed)
[facility] requesting refill on Levothyroxine 112 mcg tablet. Take one tablet po before breakfast. Pt last seen 07/24/2019

## 2020-03-06 LAB — CBC WITH DIFFERENTIAL/PLATELET
Basophils Absolute: 0 10*3/uL (ref 0.0–0.2)
Basos: 1 %
EOS (ABSOLUTE): 0.1 10*3/uL (ref 0.0–0.4)
Eos: 2 %
Hematocrit: 41.4 % (ref 34.0–46.6)
Hemoglobin: 14.4 g/dL (ref 11.1–15.9)
Immature Grans (Abs): 0 10*3/uL (ref 0.0–0.1)
Immature Granulocytes: 0 %
Lymphocytes Absolute: 1.6 10*3/uL (ref 0.7–3.1)
Lymphs: 37 %
MCH: 32.7 pg (ref 26.6–33.0)
MCHC: 34.8 g/dL (ref 31.5–35.7)
MCV: 94 fL (ref 79–97)
Monocytes Absolute: 0.4 10*3/uL (ref 0.1–0.9)
Monocytes: 8 %
Neutrophils Absolute: 2.4 10*3/uL (ref 1.4–7.0)
Neutrophils: 52 %
Platelets: 298 10*3/uL (ref 150–450)
RBC: 4.41 x10E6/uL (ref 3.77–5.28)
RDW: 12.9 % (ref 11.7–15.4)
WBC: 4.4 10*3/uL (ref 3.4–10.8)

## 2020-03-06 LAB — COMPREHENSIVE METABOLIC PANEL
ALT: 21 IU/L (ref 0–32)
AST: 25 IU/L (ref 0–40)
Albumin/Globulin Ratio: 1.7 (ref 1.2–2.2)
Albumin: 4.5 g/dL (ref 3.8–4.9)
Alkaline Phosphatase: 68 IU/L (ref 48–121)
BUN/Creatinine Ratio: 11 — ABNORMAL LOW (ref 12–28)
BUN: 10 mg/dL (ref 8–27)
Bilirubin Total: 1.4 mg/dL — ABNORMAL HIGH (ref 0.0–1.2)
CO2: 25 mmol/L (ref 20–29)
Calcium: 9.7 mg/dL (ref 8.7–10.3)
Chloride: 101 mmol/L (ref 96–106)
Creatinine, Ser: 0.89 mg/dL (ref 0.57–1.00)
GFR calc Af Amer: 81 mL/min/{1.73_m2} (ref >59)
GFR calc non Af Amer: 71 mL/min/{1.73_m2} (ref >59)
Globulin, Total: 2.7 g/dL (ref 1.5–4.5)
Glucose: 95 mg/dL (ref 65–99)
Potassium: 4.5 mmol/L (ref 3.5–5.2)
Sodium: 139 mmol/L (ref 134–144)
Total Protein: 7.2 g/dL (ref 6.0–8.5)

## 2020-03-06 LAB — LIPID PANEL
Chol/HDL Ratio: 3.2 ratio (ref 0.0–4.4)
Cholesterol, Total: 250 mg/dL — ABNORMAL HIGH (ref 100–199)
HDL: 77 mg/dL (ref >39)
LDL Chol Calc (NIH): 141 mg/dL — ABNORMAL HIGH (ref 0–99)
Triglycerides: 184 mg/dL — ABNORMAL HIGH (ref 0–149)
VLDL Cholesterol Cal: 32 mg/dL (ref 5–40)

## 2020-03-09 ENCOUNTER — Encounter: Payer: Self-pay | Admitting: Nurse Practitioner

## 2020-03-09 ENCOUNTER — Other Ambulatory Visit: Payer: Self-pay | Admitting: Nurse Practitioner

## 2020-03-09 DIAGNOSIS — E890 Postprocedural hypothyroidism: Secondary | ICD-10-CM

## 2020-03-09 MED ORDER — LEVOTHYROXINE SODIUM 112 MCG PO TABS: 112.0000 ug | ORAL_TABLET | Freq: Every day | ORAL | 0 refills | Status: DC

## 2020-03-26 LAB — TSH: TSH: 0.704 u[IU]/mL (ref 0.450–4.500)

## 2020-03-27 ENCOUNTER — Other Ambulatory Visit: Payer: Self-pay | Admitting: Nurse Practitioner

## 2020-03-27 DIAGNOSIS — E890 Postprocedural hypothyroidism: Secondary | ICD-10-CM

## 2020-03-27 MED ORDER — LEVOTHYROXINE SODIUM 112 MCG PO TABS: 112.0000 ug | ORAL_TABLET | Freq: Every day | ORAL | 1 refills | Status: DC

## 2020-04-06 ENCOUNTER — Encounter: Payer: Self-pay | Admitting: *Deleted

## 2020-04-10 ENCOUNTER — Telehealth: Payer: Self-pay | Admitting: Family Medicine

## 2020-04-10 ENCOUNTER — Encounter: Payer: Self-pay | Admitting: *Deleted

## 2020-04-10 ENCOUNTER — Other Ambulatory Visit: Payer: Self-pay | Admitting: *Deleted

## 2020-04-10 DIAGNOSIS — Z13220 Encounter for screening for lipoid disorders: Secondary | ICD-10-CM

## 2020-04-10 DIAGNOSIS — R5383 Other fatigue: Secondary | ICD-10-CM

## 2020-04-10 DIAGNOSIS — Z79899 Other long term (current) drug therapy: Secondary | ICD-10-CM

## 2020-04-10 NOTE — Telephone Encounter (Signed)
Pt schedule a Phy for Susan Cabrera on 11/19 blood work needed?

## 2020-05-29 LAB — CBC WITH DIFFERENTIAL/PLATELET
Basophils Absolute: 0 10*3/uL (ref 0.0–0.2)
Basos: 0 %
EOS (ABSOLUTE): 0.1 10*3/uL (ref 0.0–0.4)
Eos: 1 %
Hematocrit: 42.2 % (ref 34.0–46.6)
Hemoglobin: 13.8 g/dL (ref 11.1–15.9)
Immature Grans (Abs): 0 10*3/uL (ref 0.0–0.1)
Immature Granulocytes: 0 %
Lymphocytes Absolute: 1.8 10*3/uL (ref 0.7–3.1)
Lymphs: 35 %
MCH: 30.5 pg (ref 26.6–33.0)
MCHC: 32.7 g/dL (ref 31.5–35.7)
MCV: 93 fL (ref 79–97)
Monocytes Absolute: 0.4 10*3/uL (ref 0.1–0.9)
Monocytes: 8 %
Neutrophils Absolute: 2.9 10*3/uL (ref 1.4–7.0)
Neutrophils: 56 %
Platelets: 316 10*3/uL (ref 150–450)
RBC: 4.52 x10E6/uL (ref 3.77–5.28)
RDW: 12.3 % (ref 11.7–15.4)
WBC: 5.2 10*3/uL (ref 3.4–10.8)

## 2020-05-29 LAB — COMPREHENSIVE METABOLIC PANEL
ALT: 17 IU/L (ref 0–32)
AST: 20 IU/L (ref 0–40)
Albumin/Globulin Ratio: 1.6 (ref 1.2–2.2)
Albumin: 4.5 g/dL (ref 3.8–4.8)
Alkaline Phosphatase: 71 IU/L (ref 44–121)
BUN/Creatinine Ratio: 13 (ref 12–28)
BUN: 11 mg/dL (ref 8–27)
Bilirubin Total: 0.9 mg/dL (ref 0.0–1.2)
CO2: 26 mmol/L (ref 20–29)
Calcium: 9.8 mg/dL (ref 8.7–10.3)
Chloride: 99 mmol/L (ref 96–106)
Creatinine, Ser: 0.85 mg/dL (ref 0.57–1.00)
GFR calc Af Amer: 86 mL/min/{1.73_m2} (ref >59)
GFR calc non Af Amer: 74 mL/min/{1.73_m2} (ref >59)
Globulin, Total: 2.9 g/dL (ref 1.5–4.5)
Glucose: 91 mg/dL (ref 65–99)
Potassium: 4.4 mmol/L (ref 3.5–5.2)
Sodium: 138 mmol/L (ref 134–144)
Total Protein: 7.4 g/dL (ref 6.0–8.5)

## 2020-05-29 LAB — LIPID PANEL
Chol/HDL Ratio: 3 ratio (ref 0.0–4.4)
Cholesterol, Total: 232 mg/dL — ABNORMAL HIGH (ref 100–199)
HDL: 77 mg/dL (ref >39)
LDL Chol Calc (NIH): 134 mg/dL — ABNORMAL HIGH (ref 0–99)
Triglycerides: 120 mg/dL (ref 0–149)
VLDL Cholesterol Cal: 21 mg/dL (ref 5–40)

## 2020-06-05 ENCOUNTER — Other Ambulatory Visit: Payer: Self-pay

## 2020-06-05 ENCOUNTER — Ambulatory Visit (INDEPENDENT_AMBULATORY_CARE_PROVIDER_SITE_OTHER): Payer: 59 | Admitting: Nurse Practitioner

## 2020-06-05 ENCOUNTER — Encounter: Payer: Self-pay | Admitting: Nurse Practitioner

## 2020-06-05 VITALS — BP 114/78 | HR 95 | Temp 97.3°F | Ht 60.0 in | Wt 136.2 lb

## 2020-06-05 DIAGNOSIS — Z0000 Encounter for general adult medical examination without abnormal findings: Secondary | ICD-10-CM | POA: Diagnosis not present

## 2020-06-05 DIAGNOSIS — E890 Postprocedural hypothyroidism: Secondary | ICD-10-CM

## 2020-06-05 NOTE — Progress Notes (Signed)
   Subjective:    Patient ID: Susan Cabrera, female    DOB: [birth year], 59 y.o.   MRN: [id number]  HPI The patient comes in today for a wellness visit.    A review of their health history was completed.  A review of medications was also completed.  Any needed refills; none  Eating habits: healthy eating   Falls/  MVA accidents in past few months: none  Regular exercise: yes  Specialist pt sees on regular basis: none  Preventative health issues were discussed.   Additional concerns: none   Review of Systems     Objective:   Physical Exam        Assessment & Plan:

## 2020-06-05 NOTE — Progress Notes (Signed)
Subjective:    Subjective   Patient ID: Susan Cabrera, female    DOB: April 12, 1961, 59 y.o.   MRN: [id number]  HPI: The patient comes in today for a wellness visit. Significant PMH includes hypothyroidism. 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 She does report rare episodes of palpitations lasting less than a few seconds. Unassociated with activity and no associated symptoms such as dizziness, CP, syncope or SOB. Patient is followed by GYN and reports pap and mammogram completed 05/2020. Patient is postmenopausal with no change in sexual partner in last 6 months. She denies smoking or recreational drug use. She consumes occasional ETOH socially. Family hx negative for colon, breast, prostate, or ovarian/uterine cancers. Patient reports she is unsure of the date for last colonoscopy, though she believes she should be due soon. She reports receiving routine eye and dental exams. Patient reports Dexa scan 05/2020 that showed osteopenia. Vitamin D was increased at that time. Flu and COVID vaccines up to date.    A review of their health history was completed.  A review of medications was also completed.  Any needed refills; none  Eating habits: healthy eating   Falls/  MVA accidents in past few months: none  Regular exercise: yes  Specialist pt sees on regular basis: none  Preventative health issues were discussed.   Additional concerns: none   Review of Systems Review of Systems  Constitutional: Negative for chills, fever, malaise/fatigue and weight loss.  HENT: Negative for ear pain and tinnitus.   Eyes: Negative for blurred vision.  Respiratory: Negative for cough, shortness of breath and wheezing.   Cardiovascular: Positive for palpitations (Occassional ). Negative for chest pain and leg swelling.  Gastrointestinal: Negative for abdominal pain, constipation, diarrhea, heartburn, nausea and vomiting.  Genitourinary:  Negative for dysuria and urgency.  Musculoskeletal: Negative for falls.  Neurological: Negative for dizziness and headaches.  Psychiatric/Behavioral: Negative for depression, substance abuse and suicidal ideas. The patient is not nervous/anxious and does not have insomnia.        Objective:   Objective  Physical Exam Constitutional:      General: She is not in acute distress.    Appearance: Normal appearance.  Neck:     Thyroid: No thyroid mass, thyromegaly or thyroid tenderness.  Cardiovascular:     Rate and Rhythm: Normal rate and regular rhythm.     Heart sounds: Normal heart sounds. No murmur heard.  No gallop.   Pulmonary:     Effort: Pulmonary effort is normal. No respiratory distress.     Breath sounds: Normal breath sounds. No wheezing.  Neurological:     Mental Status: She is alert and oriented to person, place, and time.     Gait: Gait normal.  Psychiatric:        Mood and Affect: Mood normal.        Behavior: Behavior normal.        Thought Content: Thought content normal.        Judgment: Judgment normal.    Today's Vitals   06/05/20 1106  BP: 114/78  Pulse: 95  Temp: (!) 97.3 F (36.3 C)  SpO2: 96%  Weight: 136 lb 3.2 oz (61.8 kg)  Height: 5' (1.524 m)   Body mass index is 26.6 kg/m.   Results for orders placed or performed in visit on 04/10/20  Lipid panel  Result Value Ref Range   Cholesterol, Total 232 (H) 100 - 199 mg/dL  Triglycerides 120 0 - 149 mg/dL   HDL 77 >39 mg/dL   VLDL Cholesterol Cal 21 5 - 40 mg/dL   LDL Chol Calc (NIH) 134 (H) 0 - 99 mg/dL   Chol/HDL Ratio 3.0 0.0 - 4.4 ratio  CBC with Differential/Platelet  Result Value Ref Range   WBC 5.2 3.4 - 10.8 x10E3/uL   RBC 4.52 3.77 - 5.28 x10E6/uL   Hemoglobin 13.8 11.1 - 15.9 g/dL   Hematocrit 42.2 34.0 - 46.6 %   MCV 93 79 - 97 fL   MCH 30.5 26.6 - 33.0 pg   MCHC 32.7 31 - 35 g/dL   RDW 12.3 11.7 - 15.4 %   Platelets 316 150 - 450 x10E3/uL   Neutrophils 56 Not Estab. %    Lymphs 35 Not Estab. %   Monocytes 8 Not Estab. %   Eos 1 Not Estab. %   Basos 0 Not Estab. %   Neutrophils Absolute 2.9 1.40 - 7.00 x10E3/uL   Lymphocytes Absolute 1.8 0 - 3 x10E3/uL   Monocytes Absolute 0.4 0 - 0 x10E3/uL   EOS (ABSOLUTE) 0.1 0.0 - 0.4 x10E3/uL   Basophils Absolute 0.0 0 - 0 x10E3/uL   Immature Granulocytes 0 Not Estab. %   Immature Grans (Abs) 0.0 0.0 - 0.1 x10E3/uL  Comprehensive Metabolic Panel (CMET)  Result Value Ref Range   Glucose 91 65 - 99 mg/dL   BUN 11 8 - 27 mg/dL   Creatinine, Ser 0.85 0.57 - 1.00 mg/dL   GFR calc non Af Amer 74 >59 mL/min/1.73   GFR calc Af Amer 86 >59 mL/min/1.73   BUN/Creatinine Ratio 13 12 - 28   Sodium 138 134 - 144 mmol/L   Potassium 4.4 3.5 - 5.2 mmol/L   Chloride 99 96 - 106 mmol/L   CO2 26 20 - 29 mmol/L   Calcium 9.8 8.7 - 10.3 mg/dL   Total Protein 7.4 6.0 - 8.5 g/dL   Albumin 4.5 3.8 - 4.8 g/dL   Globulin, Total 2.9 1.5 - 4.5 g/dL   Albumin/Globulin Ratio 1.6 1.2 - 2.2   Bilirubin Total 0.9 0.0 - 1.2 mg/dL   Alkaline Phosphatase 71 44 - 121 IU/L   AST 20 0 - 40 IU/L   ALT 17 0 - 32 IU/L      Assessment & Plan:   Problem List Items Addressed This Visit      Endocrine   Postablative hypothyroidism    Other Visit Diagnoses    Routine general medical examination at a health care facility    -  Primary      Continue current medication regimen Discussed with patient when to seek immediate treatment: prolonged or frequent episodes of palpitations; SOB, syncope or onset of chest pain Continue routine GYN exams. Continue current diet routines.  Colonoscopy recommended every 10 years. Discussed with patient methods of obtaining last date of colonoscopy. Office will attempt to retrieve most current treatment date.  Return in about 1 year (around 06/05/2021) for physical.

## 2020-06-05 NOTE — Patient Instructions (Signed)
Recommend Shingles Vaccine  Office will check dates for colonoscopy provider, recommended every 10 years Continue Pap and Mammogram via GYN Continue diet and exercise Continue current medication regimen

## 2020-06-06 ENCOUNTER — Encounter: Payer: Self-pay | Admitting: Nurse Practitioner

## 2020-06-17 ENCOUNTER — Telehealth: Payer: Self-pay | Admitting: Nurse Practitioner

## 2020-06-17 NOTE — Telephone Encounter (Signed)
-----   Message from Nilda Simmer, NP sent at 06/06/2020  3:34 PM EST ----- Lavella Lemons, this patient had a colonoscopy at [facility] across from [facility] years ago. Can we find out the date so she will know when to repeat? Thanks! Hoyle Sauer

## 2020-06-17 NOTE — Telephone Encounter (Signed)
Nilda Simmer, NP  Vicente Males, LPN Lavella Lemons, this patient had a colonoscopy at [facility] across from [facility] years ago. Can we find out the date so she will know when to repeat? Thanks! Hoyle Sauer

## 2020-06-19 NOTE — Telephone Encounter (Signed)
Contacted patient. Pt states that [facility] does not sound familiar. States she went somewhere on [address]; looked up on [employer] and named off offices that came up and [employer]. Left message at [place] to return call.  Pt states once we find out the date, we can call her back and she will be glad to schedule a colonoscopy around her work schedule.

## 2020-06-19 NOTE — Telephone Encounter (Signed)
Please let patient know. We can refer for colonoscopy if she wants. Thanks.

## 2020-06-26 NOTE — Telephone Encounter (Signed)
Sorry I just read my message and I left off 10 years is when she needed to repeat.

## 2020-06-26 NOTE — Telephone Encounter (Signed)
Susan Cabrera, how many years? 10? If that is so, she is good. She could not remember when it was done. Just let her know. Thanks.

## 2020-06-26 NOTE — Telephone Encounter (Signed)
Next colonoscopy is due jan 2025. Left message to return call to notify pt.

## 2020-06-26 NOTE — Telephone Encounter (Signed)
Last colonoscopy was Aug 02, 2013 at [facility] and was told by their office staff that she needs to follow up again in years from Aug 02, 2013. Not sure if she was having problems and you wanted to refer her or if she just was asking about screening colonscopy. Let me know and I will call pt.

## 2020-07-03 ENCOUNTER — Encounter: Payer: Self-pay | Admitting: *Deleted

## 2020-07-03 NOTE — Telephone Encounter (Signed)
Sent mychart message letting pt know 

## 2020-07-27 ENCOUNTER — Telehealth: Payer: Self-pay | Admitting: Family Medicine

## 2020-07-27 DIAGNOSIS — E890 Postprocedural hypothyroidism: Secondary | ICD-10-CM

## 2020-07-27 MED ORDER — VALACYCLOVIR HCL 1 G PO TABS: ORAL_TABLET | ORAL | 2 refills | Status: DC

## 2020-07-27 MED ORDER — LEVOTHYROXINE SODIUM 112 MCG PO TABS: 112.0000 ug | ORAL_TABLET | Freq: Every day | ORAL | 1 refills | Status: DC

## 2020-07-27 NOTE — Telephone Encounter (Signed)
Refills sent to pharmacy. 

## 2020-07-27 NOTE — Addendum Note (Signed)
Addended by: Vicente Males on: 07/27/2020 04:51 PM   Modules accepted: Orders

## 2020-07-27 NOTE — Telephone Encounter (Signed)
Pt calling in to get refills on Synthroid and Valtrex. 6 month supply was sent on Synthroid back in September but pharmacy is telling pt that there are no refills. Please advise. Thank you

## 2020-07-27 NOTE — Telephone Encounter (Signed)
Pt given 6 mo supply synthroid in 9/21.  Pls give valtrex refill as it is written with 2 refills.  Thx. Dr. Lovena Le

## 2020-11-26 ENCOUNTER — Other Ambulatory Visit: Payer: Self-pay

## 2020-11-26 ENCOUNTER — Telehealth: Payer: Self-pay | Admitting: *Deleted

## 2020-11-26 ENCOUNTER — Telehealth (INDEPENDENT_AMBULATORY_CARE_PROVIDER_SITE_OTHER): Payer: 59 | Admitting: Family Medicine

## 2020-11-26 DIAGNOSIS — U071 COVID-19: Secondary | ICD-10-CM | POA: Diagnosis not present

## 2020-11-26 IMAGING — MR MRI OF THE LEFT FOREFOOT WITHOUT AND WITH CONTRAST
4 of 9 series · 20 of 40 positions shown · IV contrast (gadavist)
Comparison: Left foot x-rays dated October 12, 2018.

CLINICAL DATA: Persistent foot pain after stepping on a piece of
[place] 2 months ago.

EXAM:
MRI OF THE LEFT FOREFOOT WITHOUT AND WITH CONTRAST
TECHNIQUE: Multiplanar, multisequence MR imaging of the left forefoot was
performed both before and after administration of intravenous
contrast.
CONTRAST:  7 mL Gadavist intravenous contrast.

[Series 7: T1 · coronal · 4.0mm · 0.24mm/px · 7 of 33 slices shown]
[im 1/33]
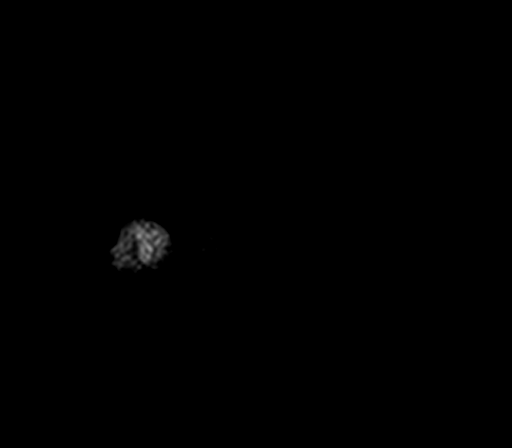
[im 6/33]
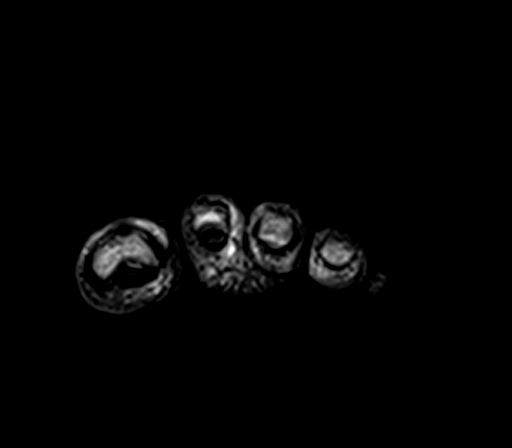
[im 11/33]
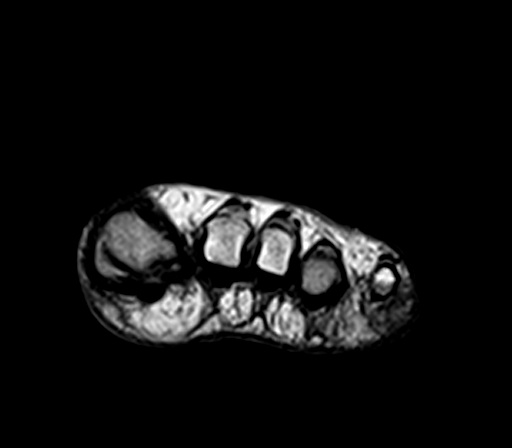
[im 17/33]
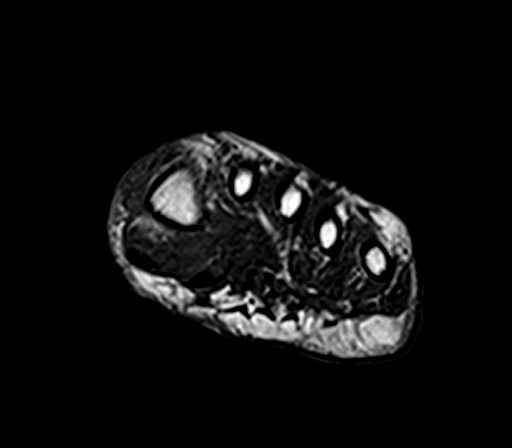
[im 22/33]
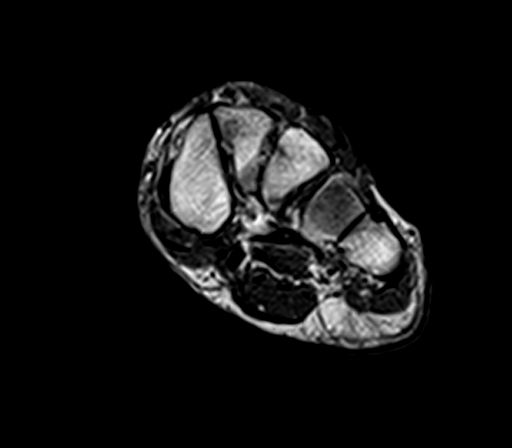
[im 27/33]
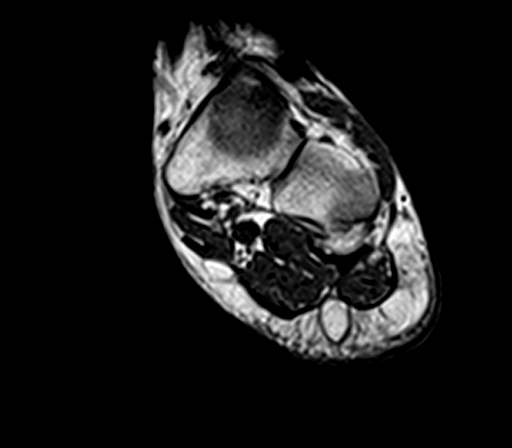
[im 33/33]
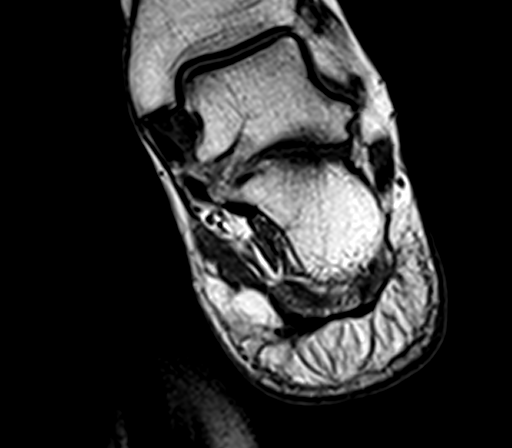

[Series 8: T2 fat-sat · coronal · 4.0mm · 0.24mm/px · 7 of 33 slices shown (1 of 2)]
[im 1/33]
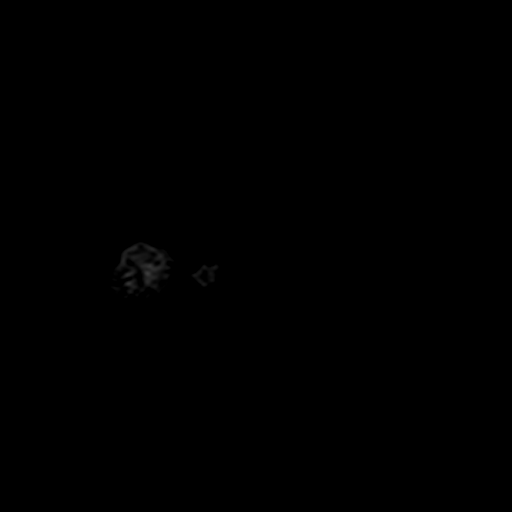
[im 6/33]
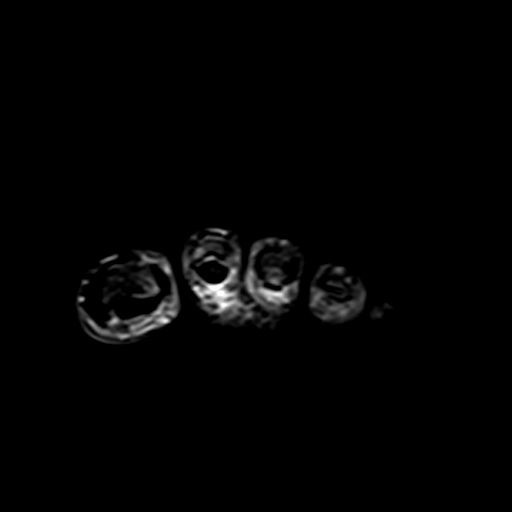
[im 11/33]
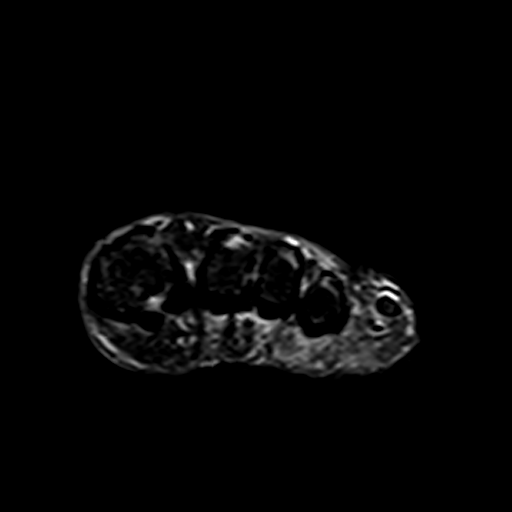
[im 17/33]
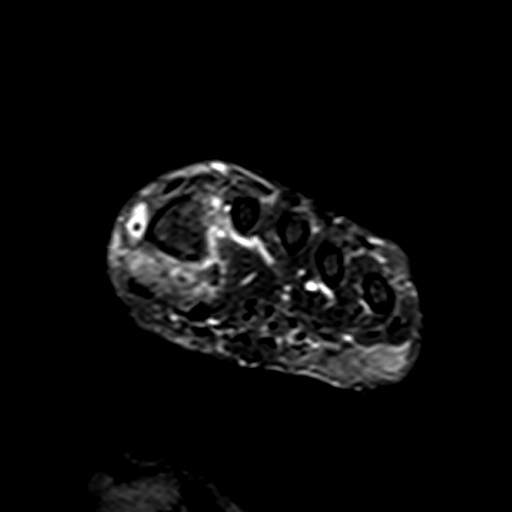
[im 22/33]
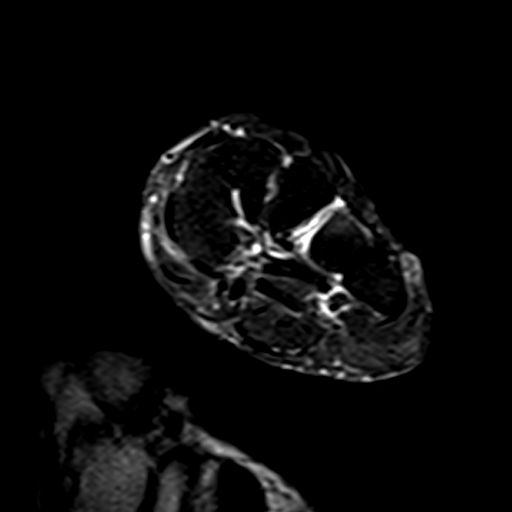
[im 27/33]
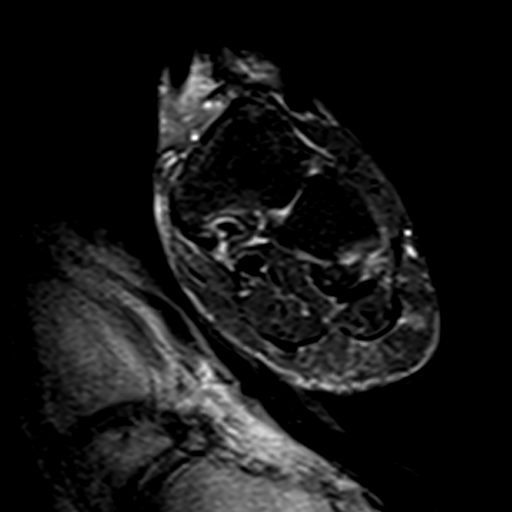
[im 33/33]
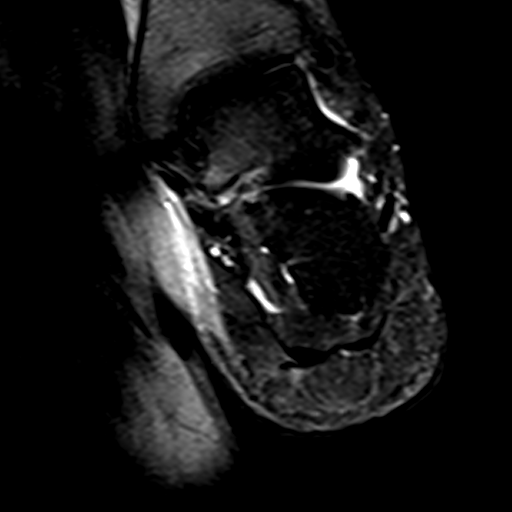

[Series 10: T1 fat-sat · coronal · non-contrast · 4.0mm · 0.24mm/px · 4 of 33 slices shown]
[im 1/33]
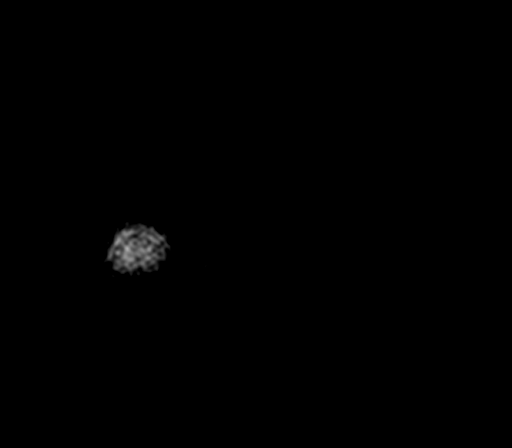
[im 7/33]
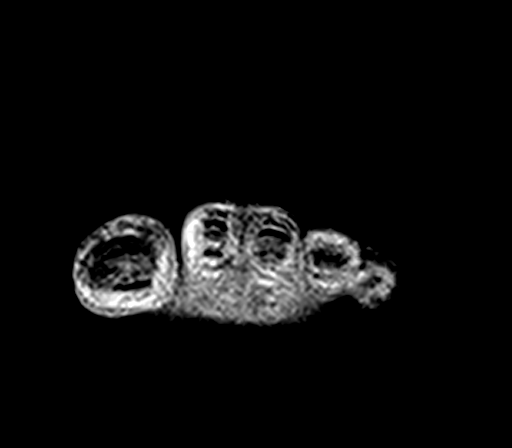
[im 20/33]
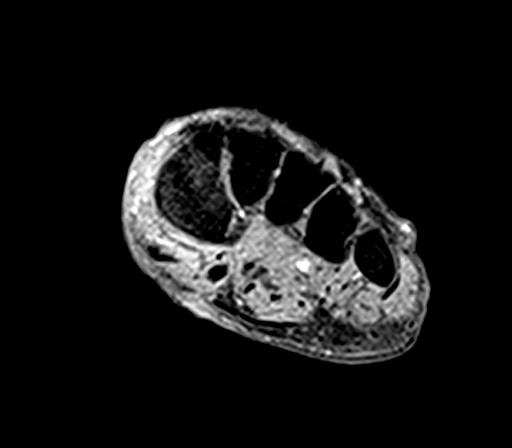
[im 33/33]
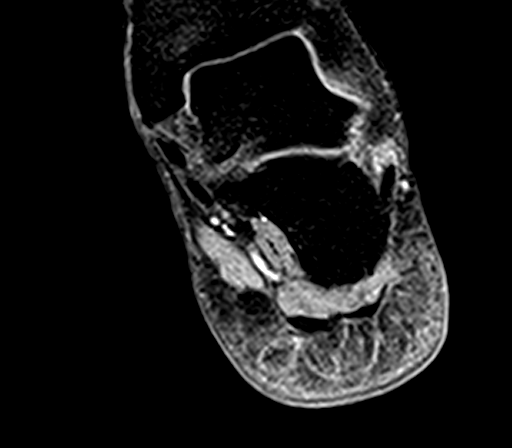

[Series 13: T2 fat-sat · oblique · 4.0mm · 0.35mm/px · 2 of 12 slices shown (2 of 2)]
[im 1/12]
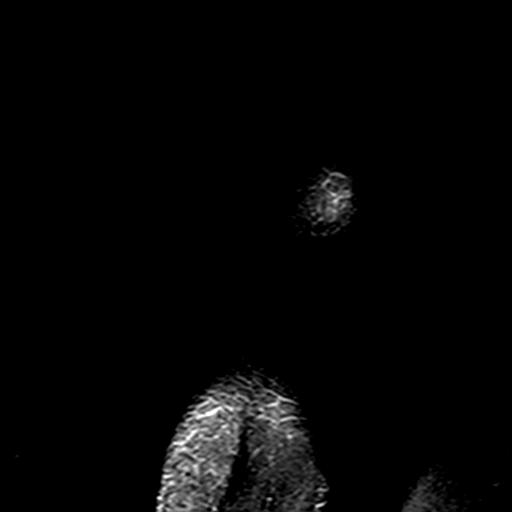
[im 12/12]
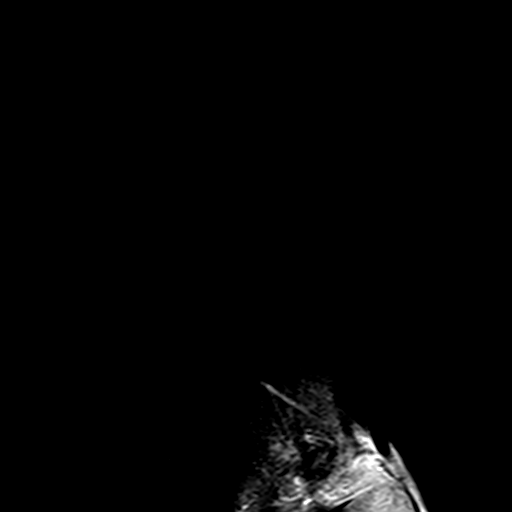

[20 of 40 positions shown; findings below may reference images not displayed]

FINDINGS: Bones/Joint/Cartilage

Focal cortical erosion along the medial plantar base of the proximal
first metatarsal. No fracture or dislocation. Normal alignment. No
joint effusion.

Ligaments

Collateral ligaments are intact.  Lisfranc ligament is intact.

Muscles and Tendons
Flexor, peroneal and extensor compartment tendons are intact. No
muscle edema or atrophy.

Soft tissue
There is a 7 mm linear T1 and T2 hypointensity at the plantar base
of the first metatarsal (series 12, image 6) likely representing a
foreign body. There is a small sinus tract extending from this area
to the plantar skin surface (series 8, image 20). There is an
additional fluid-filled tract extending along the medial aspect of
the proximal first metatarsal to a small 2.0 x 0.5 x 1.2 cm rim
enhancing fluid collection (series 8, image 20; series 13, image 7.
No soft tissue mass.
IMPRESSION: 1. 7 mm linear foreign body at the plantar base of the first
metatarsal with small sinus tracts extending to the plantar skin
surface and dorsally along the medial aspect of the proximal first
metatarsal to a small 2.0 cm abscess.
2. Focal cortical erosion along the medial plantar base of the
proximal first metatarsal adjacent to the sinus tract communicating
between the foreign body and small abscess, consistent with
osteomyelitis.

These results will be called to the ordering clinician or
representative by the Radiologist Assistant, and communication
documented in the PACS or zVision Dashboard.

## 2020-11-26 MED ORDER — BENZONATATE 100 MG PO CAPS: 100.0000 mg | ORAL_CAPSULE | Freq: Two times a day (BID) | ORAL | 0 refills | Status: AC | PRN

## 2020-11-26 MED ORDER — CHERATUSSIN AC 100-10 MG/5ML PO SOLN: 5.0000 mL | Freq: Three times a day (TID) | ORAL | 0 refills | Status: AC | PRN

## 2020-11-26 NOTE — Progress Notes (Signed)
Patient ID: Susan Cabrera, female    DOB: Feb 12, 1961, 60 y.o.   MRN: [id number]  Virtual Visit via Video Note  I connected with Eliane Decree on 11/26/20 at  1:30 PM EDT by a video enabled telemedicine application and verified that I am speaking with the correct person using two identifiers.  Location: Patient: home Provider: office   I discussed the limitations of evaluation and management by telemedicine and the availability of in person appointments. The patient expressed understanding and agreed to proceed.    Chief Complaint  Patient presents with  . Covid Positive   Subjective:    HPI  Pt tested positive for covid 4 days ago.    Still having congestion and cough for one week. Test for positive for covid 4 days ago. Taking saline rinses, vitamins and fluids.  She stating that she tested positive strongly today again.  Not sure she can return to work yet, with her hoarseness and coughing.   Having some mild throat pain. Denies fever, ear pain, or nasal drainage. No sob, or hemoptysis.  Has small amt sputum with coughing. Used aleve once.  Doing warm water gargles. Tried mucinex a few times but didn't feel much relief.   Pt is going to try to return to work on day 8, if feeling better.   Medical History Kerstyn has a past medical history of Thyroid disease.   Outpatient Encounter Medications as of 11/26/2020  Medication Sig  . benzonatate (TESSALON) 100 MG capsule Take 1 capsule (100 mg total) by mouth 2 (two) times daily as needed for cough.  . Cyanocobalamin (B-12 PO) Take by mouth.  Marland Kitchen guaiFENesin-codeine (CHERATUSSIN AC) 100-10 MG/5ML syrup Take 5 mLs by mouth 3 (three) times daily as needed for cough.  . levothyroxine (SYNTHROID) 112 MCG tablet Take 1 tablet (112 mcg total) by mouth daily before breakfast.  . OVER THE COUNTER MEDICATION Vit d Zinc  Vit c  . TURMERIC PO Take by mouth.  . valACYclovir (VALTREX) 1000 MG tablet TAKE 2 TABLETS BY MOUTH EVERY  12 HOURS FOR 2 DOSES  . [DISCONTINUED] APPLE CIDER VINEGAR PO Take by mouth.  . [DISCONTINUED] COLLAGEN PO Take by mouth.  . [DISCONTINUED] ibuprofen (ADVIL) 800 MG tablet Take 1 tablet (800 mg total) by mouth every 8 (eight) hours as needed.   No facility-administered encounter medications on file as of 11/26/2020.     Review of Systems  Constitutional: Negative for chills and fever.  HENT: Positive for congestion, sore throat and voice change. Negative for ear pain, rhinorrhea, sinus pressure and sinus pain.   Eyes: Negative for pain, discharge and itching.  Respiratory: Positive for cough.   Gastrointestinal: Negative for constipation, diarrhea, nausea and vomiting.     Vitals There were no vitals taken for this visit.  Objective:   Physical Exam  No PE due to phone visit.  Assessment and Plan   1. COVID-19 - OVER THE COUNTER MEDICATION; Vit d Zinc  Vit c - guaiFENesin-codeine (CHERATUSSIN AC) 100-10 MG/5ML syrup; Take 5 mLs by mouth 3 (three) times daily as needed for cough.  Dispense: 120 mL; Refill: 0 - benzonatate (TESSALON) 100 MG capsule; Take 1 capsule (100 mg total) by mouth 2 (two) times daily as needed for cough.  Dispense: 30 capsule; Refill: 0   Cont to use otc meds and vit d, c and zinc.  Call or rto if worsening. Gave cheratussin and tessalon perles.   Return if symptoms worsen or fail to improve.  Follow Up Instructions:    I discussed the assessment and treatment plan with the patient. The patient was provided an opportunity to ask questions and all were answered. The patient agreed with the plan and demonstrated an understanding of the instructions.   The patient was advised to call back or seek an in-person evaluation if the symptoms worsen or if the condition fails to improve as anticipated.  I provided 15 minutes of non-face-to-face time during this encounter.

## 2020-12-07 NOTE — Telephone Encounter (Signed)
error 

## 2021-01-10 ENCOUNTER — Other Ambulatory Visit: Payer: Self-pay | Admitting: Family Medicine

## 2021-01-10 DIAGNOSIS — E890 Postprocedural hypothyroidism: Secondary | ICD-10-CM

## 2021-03-26 ENCOUNTER — Other Ambulatory Visit: Payer: Self-pay | Admitting: Family Medicine

## 2021-03-26 DIAGNOSIS — E890 Postprocedural hypothyroidism: Secondary | ICD-10-CM

## 2021-04-29 ENCOUNTER — Other Ambulatory Visit: Payer: Self-pay | Admitting: Family Medicine

## 2021-04-29 DIAGNOSIS — E890 Postprocedural hypothyroidism: Secondary | ICD-10-CM

## 2021-05-14 ENCOUNTER — Other Ambulatory Visit: Payer: Self-pay | Admitting: Family Medicine

## 2021-05-23 ENCOUNTER — Other Ambulatory Visit: Payer: Self-pay | Admitting: Nurse Practitioner

## 2021-05-23 DIAGNOSIS — E890 Postprocedural hypothyroidism: Secondary | ICD-10-CM

## 2021-06-17 ENCOUNTER — Ambulatory Visit: Payer: 59 | Admitting: Family Medicine

## 2021-11-04 ENCOUNTER — Ambulatory Visit: Payer: 59

## 2021-11-04 ENCOUNTER — Ambulatory Visit (INDEPENDENT_AMBULATORY_CARE_PROVIDER_SITE_OTHER): Payer: 59 | Admitting: Sports Medicine

## 2021-11-04 DIAGNOSIS — S90852A Superficial foreign body, left foot, initial encounter: Secondary | ICD-10-CM

## 2021-11-04 DIAGNOSIS — M79672 Pain in left foot: Secondary | ICD-10-CM | POA: Diagnosis not present

## 2021-11-04 DIAGNOSIS — M792 Neuralgia and neuritis, unspecified: Secondary | ICD-10-CM

## 2021-11-04 DIAGNOSIS — S90852S Superficial foreign body, left foot, sequela: Secondary | ICD-10-CM

## 2021-11-04 DIAGNOSIS — Z9889 Other specified postprocedural states: Secondary | ICD-10-CM | POA: Diagnosis not present

## 2021-11-04 NOTE — Patient Instructions (Signed)
Alpha lipoic acid 600 mg daily

## 2021-11-04 NOTE — Progress Notes (Signed)
Subjective: ?Susan Cabrera is a 61 y.o. female patient seen today in office for checkup of her left foot states that she has been doing really good slowly over time has gotten a little bit more feeling back on the bottom of the foot but there is still one area underneath her big toe joint that still feels awkward and she cannot feel the sensation there well.  Patient also reports that she feels like the texture of the skin along the medial foot and arch is a lot different as compared to her right foot and has several questions.  Patient denies any reoccurrence of infection or any other problems at this time. ? ?Patient Active Problem List  ? Diagnosis Date Noted  ? Puncture wound of left foot 11/29/2018  ? Acute osteomyelitis ([place]) 11/29/2018  ? Postablative hypothyroidism 10/07/2017  ? ? ?Current Outpatient Medications on File Prior to Visit  ?Medication Sig Dispense Refill  ? benzonatate (TESSALON) 100 MG capsule Take 1 capsule (100 mg total) by mouth 2 (two) times daily as needed for cough. 30 capsule 0  ? Cyanocobalamin (B-12 PO) Take by mouth.    ? guaiFENesin-codeine (CHERATUSSIN AC) 100-10 MG/5ML syrup Take 5 mLs by mouth 3 (three) times daily as needed for cough. 120 mL 0  ? levothyroxine (SYNTHROID) 112 MCG tablet TAKE 1 TABLET BY MOUTH EVERY DAY BEFORE BREAKFAST 30 tablet 1  ? OVER THE COUNTER MEDICATION Vit d ?Zinc  ?Vit c    ? TURMERIC PO Take by mouth.    ? valACYclovir (VALTREX) 1000 MG tablet TAKE 2 TABLETS BY MOUTH EVERY 12 HOURS FOR 2 DOSES 4 tablet 2  ? ?No current facility-administered medications on file prior to visit.  ? ? ?Allergies  ?Allergen Reactions  ? Keflex [Cephalexin] Rash  ? ? ?Objective: ?There were no vitals filed for this visit. ? ?General: No acute distress, AAOx3  ?Left foot: Previous surgery sites well-healed.  There is some scar atrophy noted to the plantar foot and along the plantar fascia, protective sensation 90% intact with Semmes Weinstein monofilament and there is  occasional bandlike feeling to the distal forefoot on the left, capillary fill time intact all digits.  No significant pain with range of motion or limitation. ? ?Assessment and Plan:  ?Problem List Items Addressed This Visit   ?None ?Visit Diagnoses   ? ? Foreign body in left foot, sequela    -  Primary  ? Relevant Orders  ? DG Foot Complete Left  ? S/P foot surgery, left      ? Left foot pain      ? Neuritis      ? ?  ? ? ?-Patient seen and evaluated ?-Several questions asked about her surgical foot advised patient that overall she has slowly improved however may be still left with some residual numbness which sometimes can be common after having foot surgery of her extent advised patient to gently massage the foot and to add on taking alpha lipoic acid to help her nerves VI 100 mg daily to her additional vitamin regimen also advised patient that she has scar to the area so it is very common that sometimes the tissues will feel much tighter encourage patient to continue with good supportive shoes and close monitoring ?-Return as needed.  In the meantime, patient to call office if any issues or problems arise.  ? ?Landis Martins, DPM ?
